# Patient Record
Sex: Male | Born: 1970 | Race: White | Hispanic: No | Marital: Married | Smoking: Current some day smoker
Health system: Southern US, Community
[De-identification: ages and names within clinical notes are randomized; demographics above are authoritative.]

## PROBLEM LIST (undated history)

## (undated) ENCOUNTER — Emergency Department (HOSPITAL_COMMUNITY): Discharge status: Absent without leave | Payer: Managed Care, Other (non HMO)

## (undated) DIAGNOSIS — Z87442 Personal history of urinary calculi: Secondary | ICD-10-CM

## (undated) DIAGNOSIS — Z8739 Personal history of other diseases of the musculoskeletal system and connective tissue: Secondary | ICD-10-CM

## (undated) DIAGNOSIS — K219 Gastro-esophageal reflux disease without esophagitis: Secondary | ICD-10-CM

## (undated) DIAGNOSIS — S069X9A Unspecified intracranial injury with loss of consciousness of unspecified duration, initial encounter: Secondary | ICD-10-CM

## (undated) DIAGNOSIS — E119 Type 2 diabetes mellitus without complications: Secondary | ICD-10-CM

## (undated) DIAGNOSIS — M1990 Unspecified osteoarthritis, unspecified site: Secondary | ICD-10-CM

## (undated) DIAGNOSIS — K589 Irritable bowel syndrome without diarrhea: Secondary | ICD-10-CM

## (undated) DIAGNOSIS — E7800 Pure hypercholesterolemia, unspecified: Secondary | ICD-10-CM

## (undated) DIAGNOSIS — R569 Unspecified convulsions: Secondary | ICD-10-CM

## (undated) DIAGNOSIS — S069XAA Unspecified intracranial injury with loss of consciousness status unknown, initial encounter: Secondary | ICD-10-CM

## (undated) HISTORY — DX: Gastro-esophageal reflux disease without esophagitis: K21.9

## (undated) HISTORY — PX: HAND SURGERY: SHX662

## (undated) HISTORY — DX: Type 2 diabetes mellitus without complications: E11.9

---

## 2000-09-01 ENCOUNTER — Emergency Department (HOSPITAL_COMMUNITY): Admission: EM | Admit: 2000-09-01 | Discharge: 2000-09-01 | Payer: Self-pay | Admitting: Emergency Medicine

## 2000-09-01 ENCOUNTER — Encounter: Payer: Self-pay | Admitting: Emergency Medicine

## 2001-03-10 ENCOUNTER — Emergency Department (HOSPITAL_COMMUNITY): Admission: EM | Admit: 2001-03-10 | Discharge: 2001-03-10 | Payer: Self-pay

## 2001-04-07 ENCOUNTER — Emergency Department (HOSPITAL_COMMUNITY): Admission: EM | Admit: 2001-04-07 | Discharge: 2001-04-07 | Payer: Self-pay | Admitting: Physical Therapy

## 2001-12-28 ENCOUNTER — Emergency Department (HOSPITAL_COMMUNITY): Admission: EM | Admit: 2001-12-28 | Discharge: 2001-12-29 | Payer: Self-pay | Admitting: *Deleted

## 2002-07-25 ENCOUNTER — Encounter: Admission: RE | Admit: 2002-07-25 | Discharge: 2002-08-14 | Payer: Self-pay | Admitting: Occupational Medicine

## 2002-12-01 ENCOUNTER — Emergency Department (HOSPITAL_COMMUNITY): Admission: EM | Admit: 2002-12-01 | Discharge: 2002-12-01 | Payer: Self-pay | Admitting: Emergency Medicine

## 2004-12-20 ENCOUNTER — Emergency Department (HOSPITAL_COMMUNITY): Admission: EM | Admit: 2004-12-20 | Discharge: 2004-12-20 | Payer: Self-pay | Admitting: Emergency Medicine

## 2010-01-19 ENCOUNTER — Encounter: Payer: Self-pay | Admitting: Gastroenterology

## 2010-01-19 ENCOUNTER — Ambulatory Visit: Payer: Self-pay | Admitting: Internal Medicine

## 2010-01-19 DIAGNOSIS — R197 Diarrhea, unspecified: Secondary | ICD-10-CM | POA: Insufficient documentation

## 2010-01-22 LAB — CONVERTED CEMR LAB
ALT: 20 units/L (ref 0–53)
AST: 15 units/L (ref 0–37)
Albumin: 4.3 g/dL (ref 3.5–5.2)
Alkaline Phosphatase: 93 units/L (ref 39–117)
BUN: 10 mg/dL (ref 6–23)
Basophils Absolute: 0.1 10*3/uL (ref 0.0–0.1)
Basophils Relative: 1 % (ref 0–1)
CO2: 23 meq/L (ref 19–32)
Calcium: 9.5 mg/dL (ref 8.4–10.5)
Chloride: 100 meq/L (ref 96–112)
Creatinine, Ser: 1.09 mg/dL (ref 0.40–1.50)
Eosinophils Absolute: 0.1 10*3/uL (ref 0.0–0.7)
Eosinophils Relative: 2 % (ref 0–5)
Glucose, Bld: 96 mg/dL (ref 70–99)
HCT: 45.3 % (ref 39.0–52.0)
Hemoglobin: 15.2 g/dL (ref 13.0–17.0)
IgA: 369 mg/dL (ref 68–378)
Lymphocytes Relative: 30 % (ref 12–46)
Lymphs Abs: 2.5 10*3/uL (ref 0.7–4.0)
MCHC: 33.6 g/dL (ref 30.0–36.0)
MCV: 89.9 fL (ref 78.0–100.0)
Monocytes Absolute: 0.7 10*3/uL (ref 0.1–1.0)
Monocytes Relative: 8 % (ref 3–12)
Neutro Abs: 4.9 10*3/uL (ref 1.7–7.7)
Neutrophils Relative %: 59 % (ref 43–77)
Platelets: 407 10*3/uL — ABNORMAL HIGH (ref 150–400)
Potassium: 4.1 meq/L (ref 3.5–5.3)
RBC: 5.04 M/uL (ref 4.22–5.81)
RDW: 14.3 % (ref 11.5–15.5)
Sodium: 139 meq/L (ref 135–145)
TSH: 2.335 microintl units/mL (ref 0.350–4.500)
Tissue Transglutaminase Ab, IgA: 3.6 units (ref <20)
Total Bilirubin: 0.5 mg/dL (ref 0.3–1.2)
Total Protein: 7.3 g/dL (ref 6.0–8.3)
WBC: 8.3 10*3/uL (ref 4.0–10.5)

## 2010-01-25 ENCOUNTER — Ambulatory Visit: Payer: Self-pay | Admitting: Internal Medicine

## 2010-03-10 ENCOUNTER — Ambulatory Visit: Payer: Self-pay | Admitting: Internal Medicine

## 2010-03-10 DIAGNOSIS — K219 Gastro-esophageal reflux disease without esophagitis: Secondary | ICD-10-CM | POA: Insufficient documentation

## 2010-03-22 ENCOUNTER — Emergency Department (HOSPITAL_COMMUNITY): Admission: EM | Admit: 2010-03-22 | Discharge: 2010-03-22 | Payer: Self-pay | Admitting: Emergency Medicine

## 2010-06-09 ENCOUNTER — Encounter (INDEPENDENT_AMBULATORY_CARE_PROVIDER_SITE_OTHER): Payer: Self-pay | Admitting: *Deleted
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
## 2010-09-21 NOTE — Assessment & Plan Note (Signed)
Summary: DROPPED OFF STOOL.SS    Returned one iFOBT and it was negative.  Allergies: No Known Drug Allergies  Other Orders: Immuno-chemical Fecal Occult (81191)  Appended Document: DROPPED OFF STOOL.SS Stool negative for blood. Please let pt know. Continue plan as outlined at time of OV. Keep OV with RMR!  Appended Document: DROPPED OFF STOOL.SS tried to call pt- LMOM  Appended Document: DROPPED OFF STOOL.SS pt aware
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
## 2010-09-21 NOTE — Letter (Signed)
Summary: Recall Office Visit  [facility]  [address]   [place], [place] [address]   Phone: [phone]  Fax: [phone]      June 09, 2010   [place] [address] [place], [place]  [address] 05/28/1971   Dear Mr. Dekay,   According to our records, it is time for you to schedule a follow-up office visit with [place].   At your convenience, please call [phone] to schedule an office visit. If you have any questions, concerns, or feel that this letter is in error, we would appreciate your call.   Sincerely,    Diana Eves  [facility] Ph: [phone]   Fax: [phone]

## 2011-01-12 ENCOUNTER — Other Ambulatory Visit: Payer: Self-pay | Admitting: Internal Medicine

## 2011-01-21 ENCOUNTER — Telehealth: Payer: Self-pay

## 2011-01-21 MED ORDER — HYOSCYAMINE SULFATE 0.375 MG PO TB12: 0.3750 mg | ORAL_TABLET | Freq: Two times a day (BID) | ORAL | Status: DC | PRN

## 2011-01-21 NOTE — Telephone Encounter (Signed)
Pt called- was last seen by RMR in July 2011. Pt was given hyomax and nexium (per [facility] OV note) pt said hyomax is no longer on his formulary and pt is requesting an alternative. Pt uses [facility]/ [place]

## 2011-01-21 NOTE — Telephone Encounter (Signed)
Sent generic to [facility] Please let pt know Thanks

## 2014-02-05 ENCOUNTER — Ambulatory Visit: Payer: Self-pay | Admitting: Endocrinology

## 2014-02-26 ENCOUNTER — Ambulatory Visit (INDEPENDENT_AMBULATORY_CARE_PROVIDER_SITE_OTHER): Payer: Medicare Other | Admitting: Endocrinology

## 2014-02-26 ENCOUNTER — Encounter: Payer: Self-pay | Admitting: Endocrinology

## 2014-02-26 ENCOUNTER — Other Ambulatory Visit: Payer: Self-pay | Admitting: *Deleted

## 2014-02-26 VITALS — BP 101/64 | HR 65 | Temp 98.2°F | Resp 14 | Ht 64.0 in | Wt 155.2 lb

## 2014-02-26 DIAGNOSIS — E119 Type 2 diabetes mellitus without complications: Secondary | ICD-10-CM

## 2014-02-26 DIAGNOSIS — E1165 Type 2 diabetes mellitus with hyperglycemia: Principal | ICD-10-CM

## 2014-02-26 DIAGNOSIS — IMO0001 Reserved for inherently not codable concepts without codable children: Secondary | ICD-10-CM

## 2014-02-26 DIAGNOSIS — E7800 Pure hypercholesterolemia, unspecified: Secondary | ICD-10-CM

## 2014-02-26 LAB — GLUCOSE, POCT (MANUAL RESULT ENTRY): POC Glucose: 212 mg/dl — AB (ref 70–99)

## 2014-02-26 MED ORDER — LIRAGLUTIDE 18 MG/3ML ~~LOC~~ SOPN: 1.2000 mg | PEN_INJECTOR | Freq: Every day | SUBCUTANEOUS | Status: DC

## 2014-02-26 MED ORDER — INSULIN GLARGINE 100 UNIT/ML SOLOSTAR PEN: 36.0000 [IU] | PEN_INJECTOR | Freq: Every morning | SUBCUTANEOUS | Status: DC

## 2014-02-26 MED ORDER — INSULIN LISPRO 100 UNIT/ML (KWIKPEN): 8.0000 [IU] | PEN_INJECTOR | Freq: Three times a day (TID) | SUBCUTANEOUS | Status: DC

## 2014-02-26 MED ORDER — CANAGLIFLOZIN 100 MG PO TABS: 100.0000 mg | ORAL_TABLET | Freq: Every day | ORAL | Status: DC

## 2014-02-26 NOTE — Patient Instructions (Addendum)
Start VICTOZA injection with the sample pen once daily at the same time of the day preferably at bedtime.  Dial the dose to 0.6 mg for the first week.  You may  experience nausea in the first few days which usually gets better the After 1 week increase the dose to 1.2mg  daily if no nausea.  You may inject in the stomach, thigh or arm.   You will feel fullness of the stomach with starting the medication and should try to keep portions of food small.    Start taking Humalog 6 units with your main meal in the evening  Lantus 36 units in am and adjust based on readings every 3-4 days   Invokana 100 mg daily before breakfast or at the first meal of the day  Avoid high carbohydrate snacks in the morning and have more protein with dairy products or lean meat or eggs  Please check blood sugars at least half the time about 2 hours after any meal and 3-4 times a week on waking up. Please bring blood sugar monitor to each visit

## 2014-02-26 NOTE — Progress Notes (Signed)
Patient ID: Alex GrebeBrian M Bosch, male   DOB: 08/25/1970, 43 y.o.   MRN: [id number]   .   Reason for Appointment: Consultation for Type 2 Diabetes  Referring physician: Merilynn Finlandobertson  History of Present Illness:          Diagnosis: Type 2 diabetes mellitus, date of diagnosis:  2014      Past history:  He apparently had no symptoms at diagnosis and his blood sugar a year before at his physical was normal Details of his initial diagnosis are not available but he thinks his blood sugars were about 180 at that time with A1c around 9% He was initially tried on metformin but he could not tolerate this because of diarrhea Subsequently on his own he was trying to lose weight with diet and apparently lost 40 pounds around the end of 2014  Recent history: His PCP started him on insulin about 6-7 months ago when his blood sugars are high and he was also losing weight. He does not think he had any other symptoms of high blood sugars at that time He was started on small doses of Levemir but because of persistently high readings this was increased progressively and now is taking 22 units twice a day for about a month Did not bring his monitor for download today. He thinks blood sugars are very erratic and fluctuating significantly at various times of the day However he mostly has high readings after supper in the evening  He is concerned about his blood sugar not being controlled and other options besides insulin for treatment His mealtimes and sleeping patterns are very inconsistent as he works 3-4 days a week from 2 AM to 1 PM  Oral hypoglycemic drugs the patient is taking are: none     Side effects from medications have been: Metfromin INSULIN regimen is described as:  Levemir  22 bid for 1 month, previously lower doses  Glucose monitoring: About twice a day         Glucometer:  FreeStyle lite .      Blood Glucose readings from recall  Am 110-300 pcs 250-325   Hypoglycemia:   recently woke up at 5:30 AM  feeling shaky, no documented low sugars     Glycemic control:  No results found for this basename: HGBA1C   Lab Results  Component Value Date   CREATININE 1.09 01/19/2010    Self-care: The diet that the patient has been following is: tries to limit carbohydrates.      Meals: 3 meals per day. Bfst  carbs granola, lunch 9 am biscuit or sandwich dinner 7-8 pm hs snack          Exercise: farm          Dietician visit: Most recent: Never.               Compliance with the medical regimen: good Retinal exam: Most recent: 1 year ago.    Weight history: Previous range 145-185  Wt Readings from Last 3 Encounters:  02/26/14 155 lb 3.2 oz (70.398 kg)  03/10/10 162 lb 8 oz (73.71 kg)  01/19/10 165 lb (74.844 kg)      Medication List       This list is accurate as of: 02/26/14  4:33 PM.  Always use your most recent med list.               atenolol 50 MG tablet  Commonly known as:  TENORMIN  Take 50 mg by mouth  2 (two) times daily.     citalopram 40 MG tablet  Commonly known as:  CELEXA  Take 40 mg by mouth daily.     FREESTYLE LITE TEST VI  by In Vitro route. Checks 3 times a day     LEVEMIR FLEXTOUCH [place]  Inject 22 Units into the skin 2 (two) times daily.     levETIRAcetam 750 MG tablet  Commonly known as:  KEPPRA  Take 750 mg by mouth 2 (two) times daily.     NEXIUM 40 MG capsule  Generic drug:  esomeprazole  TAKE ONE CAPSULE BY MOUTH EVERY DAY     pravastatin 40 MG tablet  Commonly known as:  PRAVACHOL  Take 40 mg by mouth daily.        Allergies:  Allergies  Allergen Reactions  . Codeine Itching    No past medical history on file.  No past surgical history on file.  No family history on file.  Social History:  reports that he has been smoking.  He has never used smokeless tobacco. His alcohol and drug histories are not on file.    Review of Systems       Lipids:        No results found for this basename: CHOL, HDL, LDLCALC, LDLDIRECT, TRIG,  CHOLHDL        No unusual headaches.                  Skin: No rash or infections     Thyroid:  No  unusual fatigue.     The blood pressure has been      No swelling of feet.     No shortness of breath on exertion.     Bowel habits: Normal.       No frequency of urination or nocturia       No joint  pains.         No history of Numbness, tingling or burning in feet     LABS:  Office Visit on 02/26/2014  Component Date Value Ref Range Status  . POC Glucose 02/26/2014 212* 70 - 99 mg/dl Final    Physical Examination:  BP 101/64  Pulse 65  Temp(Src) 98.2 F (36.8 C)  Resp 14  Ht 5\' 4"  (1.626 m)  Wt 155 lb 3.2 oz (70.398 kg)  BMI 26.63 kg/m2  SpO2 95%  GENERAL:         Patient is well built and nourished, no cushingoid features HEENT:         Eye exam shows normal external appearance. Fundus exam shows no retinopathy. Oral exam shows normal mucosa .  NECK:         General:  Neck exam shows no lymphadenopathy.   Thyroid is not enlarged and no nodules felt.   LUNGS:         Chest is symmetrical. Lungs are clear to auscultation.[name]   HEART:         Heart sounds:  S1 and S2 are normal. No murmurs or clicks heard., no S3 or S4.   ABDOMEN:   There is no distention present. Liver and spleen are not palpable. No other mass or tenderness present.  EXTREMITIES:     There is no edema. No skin lesions present.[name]  NEUROLOGICAL:   Vibration sense is minimally reduced in toes. Ankle jerks are absent bilaterally.          Diabetic foot exam:  has no abnormal findings MUSCULOSKELETAL:  There is no enlargement or deformity of the joints. Spine is normal to inspection.[name].   SKIN:       No rash; no significant acanthosis       ASSESSMENT:  Diabetes, unknown type, uncontrolled    His hyperglycemia has been relatively acute in onset although not very symptomatic initially and not clear if he had evidence of ketonuria or ketosis at diagnosis Overall he has lost weight since initial  diagnosis and is not obese now Not clear if he is insulin deficient but currently is having poor control with taking only basal insulin; still appears to be insulin resistant since he is taking 44 units a day and will have high readings even overnight Since he is interested in knowing whether he has type I or type 2 diabetes will be beneficial to check a C-peptide level  Meanwhile he appears to be having significant postprandial hyperglycemia and also some tendency to low normal blood sugars overnight with current regimen of Levemir He is requesting a change to Lantus insulin for a lower co-pay also and maybe would get by with once a day injection  PLAN:    Discussed needing mealtime coverage at least for his main meal with rapid acting insulin because of relatively high postprandial reading at bedtime Discussed with the patient the nature of GLP-1 drugs, the action on various organ systems, how they benefit blood glucose control, as well as the benefit of weight loss and  increase satiety . Explained possible side effects especially nausea and vomiting; discussed safety information in package insert. Described injection technique and dosage titration of Victoza  starting with 0.6 mg once a day at the same time for the first week and then increasing to 1.2 mg if no symptoms of nausea. Patient brochure on Victoza and co-pay card given Trial of Invokana which would help overall with blood sugar control, discussed mechanism of action and benefits on glucose along with other effects on weight and blood pressure, possible side effects Lantus once a day and this will be titrated based on fasting blood sugars  May also consider V-go pump if insulin needs to be continued and changed to basal bolus pattern, this will be beneficial with his variable work schedule  He will bring his monitor for download on the next visit  Patient Instructions  Start VICTOZA injection with the sample pen once daily at the  same time of the day preferably at bedtime.  Dial the dose to 0.6 mg for the first week.  You may  experience nausea in the first few days which usually gets better the After 1 week increase the dose to 1.2mg  daily if no nausea.  You may inject in the stomach, thigh or arm.   You will feel fullness of the stomach with starting the medication and should try to keep portions of food small.    Start taking Humalog 6 units with your main meal in the evening  Lantus 36 units in am and adjust based on readings every 3-4 days   Invokana 100 mg daily before breakfast or at the first meal of the day  Avoid high carbohydrate snacks in the morning and have more protein with dairy products or lean meat or eggs  Please check blood sugars at least half the time about 2 hours after any meal and 3-4 times a week on waking up. Please bring blood sugar monitor to each visit    [facility]) HospitalKUMAR,Jayke Caul 02/26/2014, 4:33 PM   Note: This office note  was prepared with Insurance underwriter. Any transcriptional errors that result from this process are unintentional.  Addendum: Labs as follows  Office Visit on 02/26/2014  Component Date Value Ref Range Status  . POC Glucose 02/26/2014 212* 70 - 99 mg/dl Final  . Hemoglobin C1Y 02/26/2014 11.2* 4.6 - 6.5 % Final   Glycemic Control Guidelines for People with Diabetes:Non Diabetic:  <6%Goal of Therapy: <7%Additional Action Suggested:  >8%   . Sodium 02/26/2014 138  135 - 145 mEq/L Final  . Potassium 02/26/2014 3.8  3.5 - 5.1 mEq/L Final  . Chloride 02/26/2014 104  96 - 112 mEq/L Final  . CO2 02/26/2014 24  19 - 32 mEq/L Final  . Glucose, Bld 02/26/2014 198* 70 - 99 mg/dL Final  . BUN 60/63/0160 11  6 - 23 mg/dL Final  . Creatinine, Ser 02/26/2014 1.0  0.4 - 1.5 mg/dL Final  . Calcium 10/93/2355 9.0  8.4 - 10.5 mg/dL Final  . GFR 73/22/0254 84.07  >60.00 mL/min Final  . C-Peptide 02/26/2014 1.39  0.80 - 3.90 ng/mL Final  . Microalb, Ur 02/26/2014 0.1   0.0 - 1.9 mg/dL Final  . Creatinine,U 27/01/2375 34.4   Final  . Microalb Creat Ratio 02/26/2014 0.3  0.0 - 30.0 mg/g Final

## 2014-02-27 LAB — BASIC METABOLIC PANEL
BUN: 11 mg/dL (ref 6–23)
CHLORIDE: 104 meq/L (ref 96–112)
CO2: 24 mEq/L (ref 19–32)
CREATININE: 1 mg/dL (ref 0.4–1.5)
Calcium: 9 mg/dL (ref 8.4–10.5)
GFR: 84.07 mL/min (ref >60.00)
GLUCOSE: 198 mg/dL — AB (ref 70–99)
POTASSIUM: 3.8 meq/L (ref 3.5–5.1)
Sodium: 138 mEq/L (ref 135–145)

## 2014-02-27 LAB — MICROALBUMIN / CREATININE URINE RATIO
Creatinine,U: 34.4 mg/dL
MICROALB UR: 0.1 mg/dL (ref 0.0–1.9)
Microalb Creat Ratio: 0.3 mg/g (ref 0.0–30.0)

## 2014-02-27 LAB — HEMOGLOBIN A1C: Hgb A1c MFr Bld: 11.2 % — ABNORMAL HIGH (ref 4.6–6.5)

## 2014-02-28 DIAGNOSIS — G40909 Epilepsy, unspecified, not intractable, without status epilepticus: Secondary | ICD-10-CM | POA: Insufficient documentation

## 2014-02-28 DIAGNOSIS — IMO0002 Reserved for concepts with insufficient information to code with codable children: Secondary | ICD-10-CM | POA: Insufficient documentation

## 2014-02-28 DIAGNOSIS — E7800 Pure hypercholesterolemia, unspecified: Secondary | ICD-10-CM | POA: Insufficient documentation

## 2014-02-28 DIAGNOSIS — E1165 Type 2 diabetes mellitus with hyperglycemia: Secondary | ICD-10-CM | POA: Insufficient documentation

## 2014-02-28 LAB — C-PEPTIDE: C PEPTIDE: 1.39 ng/mL (ref 0.80–3.90)

## 2014-02-28 NOTE — Progress Notes (Signed)
Quick Note:  Please let patient know that the C-peptide result is normal and A1c 11.2 ______

## 2014-03-05 ENCOUNTER — Telehealth: Payer: Self-pay | Admitting: Endocrinology

## 2014-03-05 NOTE — Telephone Encounter (Signed)
Pt LM would like to see if there are alternative meds that could replace the victoza is will cost him over $100 and does not have the ability to pay that much

## 2014-03-05 NOTE — Telephone Encounter (Signed)
He needs to find out from [employer] which drug is covered in the family of Victoza. Also he is supposed to be scheduled for followup in 2 weeks and this has not been done

## 2014-03-05 NOTE — Telephone Encounter (Signed)
Please see below and advise.

## 2014-03-05 NOTE — Telephone Encounter (Signed)
Instructions left on patients vm, instructed him to please call and schedule 2 week f/u

## 2014-03-06 NOTE — Telephone Encounter (Signed)
Please see below and advise.

## 2014-03-06 NOTE — Telephone Encounter (Signed)
Pt called back to schedule appointment. Earliest that pt could be scheduled was 04/09/2014. Pt states that he called his insurance company and they would not advise him on what medication were preferred for the Victoza. Insurance instructed him that the Dr needed to come up with a list of medications that he wanted to try and call them back. Insurance stated they could not inform pt of an alternative.  Please advise pt,  Thanks!

## 2014-03-06 NOTE — Telephone Encounter (Signed)
He can take Bydureon, Tanzeum, Trulicity or Byetta

## 2014-03-07 ENCOUNTER — Other Ambulatory Visit: Payer: Self-pay | Admitting: *Deleted

## 2014-03-07 MED ORDER — DULAGLUTIDE 0.75 MG/0.5ML ~~LOC~~ SOAJ: SUBCUTANEOUS | Status: DC

## 2014-03-20 ENCOUNTER — Telehealth: Payer: Self-pay | Admitting: Endocrinology

## 2014-03-20 NOTE — Telephone Encounter (Signed)
Humalog is taken around 7 pm and lantus is taken about an hour later. He has an appt scheduled on 8/19

## 2014-03-20 NOTE — Telephone Encounter (Signed)
Need to see him as soon as possible, okay to reduce Lantus as discussed

## 2014-03-20 NOTE — Telephone Encounter (Signed)
Patient called and said in the last week and a half his sugars have been in the upper 40's and mid 50's, usually around midnight. He says he think it's been 3-4 times.  Below is what he's taking for his sugar, please advise.

## 2014-03-20 NOTE — Telephone Encounter (Signed)
Patient stated that his blood has been dropping really low, he is taking Humalog 100 unit, Invokana 100 mg and Lantus 100 units. He want to know if he need to stop taking the Lantus.  Please advise

## 2014-03-20 NOTE — Telephone Encounter (Signed)
Reduce Lantus to 14, when does he take pm Humalog? New appt scheduled?

## 2014-03-20 NOTE — Telephone Encounter (Signed)
Message is not clear: What dose of Humalog in Lantus is he taking and are the sugars low after dinner?. He needs to be seen as soon as possible instead of in 3 weeks

## 2014-03-20 NOTE — Telephone Encounter (Signed)
He's taking 20 units of lantus, and 6 units of  humalog both at night  and he's taking the Invokana 100mg  in the morning.

## 2014-03-20 NOTE — Telephone Encounter (Signed)
Message said he's taking 100 units of each.  He said his sugars have only been low around midnight and that it wakes him up.

## 2014-03-27 ENCOUNTER — Emergency Department (HOSPITAL_COMMUNITY)
Admission: EM | Admit: 2014-03-27 | Discharge: 2014-03-27 | Disposition: A | Discharge status: Home / Self Care | Payer: Medicare Other | Attending: Emergency Medicine | Admitting: Emergency Medicine

## 2014-03-27 ENCOUNTER — Other Ambulatory Visit: Payer: Self-pay

## 2014-03-27 ENCOUNTER — Encounter (HOSPITAL_COMMUNITY): Payer: Self-pay | Admitting: Emergency Medicine

## 2014-03-27 ENCOUNTER — Emergency Department (HOSPITAL_COMMUNITY): Payer: Medicare Other

## 2014-03-27 DIAGNOSIS — E7800 Pure hypercholesterolemia, unspecified: Secondary | ICD-10-CM | POA: Diagnosis not present

## 2014-03-27 DIAGNOSIS — Z79899 Other long term (current) drug therapy: Secondary | ICD-10-CM | POA: Insufficient documentation

## 2014-03-27 DIAGNOSIS — Z87820 Personal history of traumatic brain injury: Secondary | ICD-10-CM | POA: Insufficient documentation

## 2014-03-27 DIAGNOSIS — Z794 Long term (current) use of insulin: Secondary | ICD-10-CM | POA: Insufficient documentation

## 2014-03-27 DIAGNOSIS — F17200 Nicotine dependence, unspecified, uncomplicated: Secondary | ICD-10-CM | POA: Diagnosis not present

## 2014-03-27 DIAGNOSIS — R079 Chest pain, unspecified: Secondary | ICD-10-CM

## 2014-03-27 DIAGNOSIS — G40909 Epilepsy, unspecified, not intractable, without status epilepticus: Secondary | ICD-10-CM | POA: Insufficient documentation

## 2014-03-27 HISTORY — DX: Unspecified intracranial injury with loss of consciousness of unspecified duration, initial encounter: S06.9X9A

## 2014-03-27 HISTORY — DX: Unspecified intracranial injury with loss of consciousness status unknown, initial encounter: S06.9XAA

## 2014-03-27 HISTORY — DX: Pure hypercholesterolemia, unspecified: E78.00

## 2014-03-27 HISTORY — DX: Unspecified convulsions: R56.9

## 2014-03-27 LAB — COMPREHENSIVE METABOLIC PANEL
ALT: 17 U/L (ref 0–53)
AST: 13 U/L (ref 0–37)
Albumin: 3.7 g/dL (ref 3.5–5.2)
Alkaline Phosphatase: 86 U/L (ref 39–117)
Anion gap: 15 (ref 5–15)
BUN: 22 mg/dL (ref 6–23)
CALCIUM: 9 mg/dL (ref 8.4–10.5)
CO2: 23 mEq/L (ref 19–32)
CREATININE: 1.07 mg/dL (ref 0.50–1.35)
Chloride: 95 mEq/L — ABNORMAL LOW (ref 96–112)
GFR calc Af Amer: >90 mL/min (ref >90)
GFR, EST NON AFRICAN AMERICAN: 84 mL/min — AB (ref >90)
Glucose, Bld: 278 mg/dL — ABNORMAL HIGH (ref 70–99)
Potassium: 3.8 mEq/L (ref 3.7–5.3)
Sodium: 133 mEq/L — ABNORMAL LOW (ref 137–147)
Total Bilirubin: <0.2 mg/dL — ABNORMAL LOW (ref 0.3–1.2)
Total Protein: 7.3 g/dL (ref 6.0–8.3)

## 2014-03-27 LAB — CBC WITH DIFFERENTIAL/PLATELET
BASOS ABS: 0.1 10*3/uL (ref 0.0–0.1)
Basophils Relative: 1 % (ref 0–1)
EOS PCT: 2 % (ref 0–5)
Eosinophils Absolute: 0.2 10*3/uL (ref 0.0–0.7)
HCT: 42.8 % (ref 39.0–52.0)
Hemoglobin: 15.5 g/dL (ref 13.0–17.0)
Lymphocytes Relative: 32 % (ref 12–46)
Lymphs Abs: 3 10*3/uL (ref 0.7–4.0)
MCH: 30.9 pg (ref 26.0–34.0)
MCHC: 36.2 g/dL — AB (ref 30.0–36.0)
MCV: 85.4 fL (ref 78.0–100.0)
MONO ABS: 0.8 10*3/uL (ref 0.1–1.0)
Monocytes Relative: 9 % (ref 3–12)
Neutro Abs: 5.2 10*3/uL (ref 1.7–7.7)
Neutrophils Relative %: 56 % (ref 43–77)
Platelets: 315 10*3/uL (ref 150–400)
RBC: 5.01 MIL/uL (ref 4.22–5.81)
RDW: 12.4 % (ref 11.5–15.5)
WBC: 9.3 10*3/uL (ref 4.0–10.5)

## 2014-03-27 LAB — D-DIMER, QUANTITATIVE (NOT AT ARMC): D DIMER QUANT: 0.36 ug{FEU}/mL (ref 0.00–0.48)

## 2014-03-27 LAB — TROPONIN I

## 2014-03-27 MED ORDER — KETOROLAC TROMETHAMINE 30 MG/ML IJ SOLN
30.0000 mg | Freq: Once | INTRAMUSCULAR | Status: AC
  Administered 2014-03-27: 30 mg via INTRAVENOUS
  Filled 2014-03-27: qty 1

## 2014-03-27 NOTE — Discharge Instructions (Signed)
Ibuprofen 600 mg every 6 hours as needed for pain.   Followup with cardiology. The contact information has been provided in this discharge summary. Call to arrange this appointment. Return to the ER if your symptoms substantially worsen or change.   Chest Pain (Nonspecific) It is often hard to give a specific diagnosis for the cause of chest pain. There is always a chance that your pain could be related to something serious, such as a heart attack or a blood clot in the lungs. You need to follow up with your health care provider for further evaluation. CAUSES   Heartburn.  Pneumonia or bronchitis.  Anxiety or stress.  Inflammation around your heart (pericarditis) or lung (pleuritis or pleurisy).  A blood clot in the lung.  A collapsed lung (pneumothorax). It can develop suddenly on its own (spontaneous pneumothorax) or from trauma to the chest.  Shingles infection (herpes zoster virus). The chest wall is composed of bones, muscles, and cartilage. Any of these can be the source of the pain.  The bones can be bruised by injury.  The muscles or cartilage can be strained by coughing or overwork.  The cartilage can be affected by inflammation and become sore (costochondritis). DIAGNOSIS  Lab tests or other studies may be needed to find the cause of your pain. Your health care provider may have you take a test called an ambulatory electrocardiogram (ECG). An ECG records your heartbeat patterns over a 24-hour period. You may also have other tests, such as:  Transthoracic echocardiogram (TTE). During echocardiography, sound waves are used to evaluate how blood flows through your heart.  Transesophageal echocardiogram (TEE).  Cardiac monitoring. This allows your health care provider to monitor your heart rate and rhythm in real time.  Holter monitor. This is a portable device that records your heartbeat and can help diagnose heart arrhythmias. It allows your health care provider to track  your heart activity for several days, if needed.  Stress tests by exercise or by giving medicine that makes the heart beat faster. TREATMENT   Treatment depends on what may be causing your chest pain. Treatment may include:  Acid blockers for heartburn.  Anti-inflammatory medicine.  Pain medicine for inflammatory conditions.  Antibiotics if an infection is present.  You may be advised to change lifestyle habits. This includes stopping smoking and avoiding alcohol, caffeine, and chocolate.  You may be advised to keep your head raised (elevated) when sleeping. This reduces the chance of acid going backward from your stomach into your esophagus. Most of the time, nonspecific chest pain will improve within 2-3 days with rest and mild pain medicine.  HOME CARE INSTRUCTIONS   If antibiotics were prescribed, take them as directed. Finish them even if you start to feel better.  For the next few days, avoid physical activities that bring on chest pain. Continue physical activities as directed.  Do not use any tobacco products, including cigarettes, chewing tobacco, or electronic cigarettes.  Avoid drinking alcohol.  Only take medicine as directed by your health care provider.  Follow your health care provider's suggestions for further testing if your chest pain does not go away.  Keep any follow-up appointments you made. If you do not go to an appointment, you could develop lasting (chronic) problems with pain. If there is any problem keeping an appointment, call to reschedule. SEEK MEDICAL CARE IF:   Your chest pain does not go away, even after treatment.  You have a rash with blisters on your chest.  You have a fever. SEEK IMMEDIATE MEDICAL CARE IF:   You have increased chest pain or pain that spreads to your arm, neck, jaw, back, or abdomen.  You have shortness of breath.  You have an increasing cough, or you cough up blood.  You have severe back or abdominal pain.  You  feel nauseous or vomit.  You have severe weakness.  You faint.  You have chills. This is an emergency. Do not wait to see if the pain will go away. Get medical help at once. Call your local emergency services (911 in [place] Do not drive yourself to the hospital. MAKE SURE YOU:   Understand these instructions.  Will watch your condition.  Will get help right away if you are not doing well or get worse. Document Released: 05/18/2005 Document Revised: 08/13/2013 Document Reviewed: 03/13/2008 [facility] [place] Patient Information 2015 [employer]. This information is not intended to replace advice given to you by your health care provider. Make sure you discuss any questions you have with your health care provider.

## 2014-03-27 NOTE — ED Notes (Signed)
Pt c/o intermittent cp with weakness/sob and nausea today.

## 2014-03-27 NOTE — ED Provider Notes (Addendum)
CSN: [id number]     Arrival date & time 03/27/14  1723 History   First MD Initiated Contact with Patient 03/27/14 1742     Chief Complaint  Patient presents with  . Chest Pain     (Consider location/radiation/quality/duration/timing/severity/associated sxs/prior Treatment) HPI Comments: Patient is a 43 year old male with history of diabetes diagnosed 2 years ago. He presents today with complaints of sharp pains in the left side of his chest that started upon waking this morning. He denies any nausea. States that he feels short of breath intermittently. His pain is worse with movement and palpation and relieved with rest.  Patient is a 43 y.o. male presenting with chest pain. The history is provided by the patient.  Chest Pain Pain location:  Substernal area Pain quality: sharp   Pain radiates to:  L shoulder Pain radiates to the back: no   Pain severity:  Moderate Onset quality:  Sudden Duration:  12 hours Timing:  Constant Progression:  Unchanged Chronicity:  New Context: movement   Context comment:  Position Relieved by:  Rest Worsened by:  Certain positions and movement   Past Medical History  Diagnosis Date  . High cholesterol   . TBI (traumatic brain injury)   . Seizures    Past Surgical History  Procedure Laterality Date  . Hand surgery     Family History  Problem Relation Age of Onset  . Diabetes Mother   . Diabetes Paternal Grandmother   . Diabetes Paternal Grandfather    History  Substance Use Topics  . Smoking status: Current Every Day Smoker  . Smokeless tobacco: Never Used  . Alcohol Use: No    Review of Systems  Cardiovascular: Positive for chest pain.  All other systems reviewed and are negative.     Allergies  Codeine  Home Medications   Prior to Admission medications   Medication Sig Start Date End Date Taking? Authorizing Provider  atenolol (TENORMIN) 50 MG tablet Take 100 mg by mouth daily.    Yes Historical Provider, MD   Canagliflozin (INVOKANA) 100 MG TABS Take 1 tablet (100 mg total) by mouth daily. 02/26/14  Yes Reather Littler, MD  citalopram (CELEXA) 40 MG tablet Take 40 mg by mouth daily.   Yes Historical Provider, MD  Insulin Glargine (LANTUS SOLOSTAR) 100 UNIT/ML Solostar Pen Inject 14 Units into the skin daily at 10 pm.   Yes Historical Provider, MD  insulin lispro (HUMALOG KWIKPEN) 100 UNIT/ML KiwkPen Inject 6 Units into the skin daily.   Yes Historical Provider, MD  levETIRAcetam (KEPPRA) 750 MG tablet Take 1,500 mg by mouth 2 (two) times daily.   Yes Historical Provider, MD  omeprazole (PRILOSEC) 20 MG capsule Take 20 mg by mouth daily.   Yes Historical Provider, MD  pravastatin (PRAVACHOL) 40 MG tablet Take 40 mg by mouth daily.   Yes Historical Provider, MD   BP 111/78  Pulse 65  Temp(Src) 98.3 F (36.8 C) (Oral)  Resp 14  Ht 5\' 4"  (1.626 m)  Wt 155 lb (70.308 kg)  BMI 26.59 kg/m2  SpO2 98% Physical Exam  Nursing note and vitals reviewed. Constitutional: He is oriented to person, place, and time. He appears well-developed and well-nourished. No distress.  HENT:  Head: Normocephalic and atraumatic.  Mouth/Throat: Oropharynx is clear and moist.  Neck: Normal range of motion. Neck supple.  Cardiovascular: Normal rate, regular rhythm and normal heart sounds.   No murmur heard. Pulmonary/Chest: Breath sounds normal. No respiratory distress. He has no wheezes.  He exhibits tenderness.  Abdominal: Soft. Bowel sounds are normal. He exhibits no distension. There is no tenderness.  Musculoskeletal: Normal range of motion. He exhibits no edema.  Neurological: He is alert and oriented to person, place, and time.  Skin: Skin is warm and dry. He is not diaphoretic.    ED Course  Procedures (including critical care time) Labs Review Labs Reviewed  CBC WITH DIFFERENTIAL - Abnormal; Notable for the following:    MCHC 36.2 (*)    All other components within normal limits  COMPREHENSIVE METABOLIC PANEL -  Abnormal; Notable for the following:    Sodium 133 (*)    Chloride 95 (*)    Glucose, Bld 278 (*)    Total Bilirubin <0.2 (*)    GFR calc non Af Amer 84 (*)    All other components within normal limits  D-DIMER, QUANTITATIVE  TROPONIN I    Imaging Review Dg Chest 2 View  03/27/2014   CLINICAL DATA:  Chest pain  EXAM: CHEST  2 VIEW  COMPARISON:  None.  FINDINGS: The heart size and mediastinal contours are within normal limits. Both lungs are clear. The visualized skeletal structures are unremarkable.  IMPRESSION: No active cardiopulmonary disease.   Electronically Signed   By: Marlan Palauharles  Clark M.D.   On: 03/27/2014 19:19     EKG Interpretation   Date/Time:  Thursday March 27 2014 17:31:40 EDT Ventricular Rate:  78 PR Interval:  156 QRS Duration: 88 QT Interval:  392 QTC Calculation: 446 R Axis:   35 Text Interpretation:  Normal sinus rhythm Normal ECG Confirmed by DELOS   MD, Herron Fero ([id number]) on 03/27/2014 8:01:25 PM      MDM   Final diagnoses:  None    He presents with complaints of sharp pain to the left lateral chest. This started this morning shortly upon waking. It is worse with position and palpation. His symptoms are atypical for cardiac pain an EKG is normal with negative troponin. He was also found to have a negative d-dimer with no hypoxia and no tachycardia.  I feel as though pulmonary embolism is unlikely. At this point I feel as though he is appropriate for discharge. I will recommend continued anti-inflammatories and when necessary return.  As the patient is diabetic and has a family history of heart problems, I will provide [place]. Although his symptoms are atypical and workup is negative and I have a low suspicion of a cardiac etiology, I do feel as though a baseline stress test may be in his best interest.   Geoffery Lyonsouglas Charidy Cappelletti, MD 03/27/14 [id number]  Geoffery Lyonsouglas Lexton Hidalgo, MD 03/27/14 2045

## 2014-04-09 ENCOUNTER — Ambulatory Visit: Payer: Medicare Other | Admitting: Endocrinology

## 2014-04-16 ENCOUNTER — Ambulatory Visit (INDEPENDENT_AMBULATORY_CARE_PROVIDER_SITE_OTHER): Payer: Medicare Other | Admitting: Endocrinology

## 2014-04-16 ENCOUNTER — Encounter: Payer: Self-pay | Admitting: Endocrinology

## 2014-04-16 VITALS — BP 118/64 | HR 96 | Temp 98.3°F | Resp 14 | Ht 64.0 in | Wt 147.4 lb

## 2014-04-16 DIAGNOSIS — IMO0001 Reserved for inherently not codable concepts without codable children: Secondary | ICD-10-CM

## 2014-04-16 DIAGNOSIS — E1165 Type 2 diabetes mellitus with hyperglycemia: Principal | ICD-10-CM

## 2014-04-16 DIAGNOSIS — E7800 Pure hypercholesterolemia, unspecified: Secondary | ICD-10-CM

## 2014-04-16 LAB — BASIC METABOLIC PANEL
BUN: 16 mg/dL (ref 6–23)
CALCIUM: 9.4 mg/dL (ref 8.4–10.5)
CO2: 28 mEq/L (ref 19–32)
CREATININE: 1.2 mg/dL (ref 0.4–1.5)
Chloride: 98 mEq/L (ref 96–112)
GFR: 71.82 mL/min (ref >60.00)
Glucose, Bld: 284 mg/dL — ABNORMAL HIGH (ref 70–99)
Potassium: 4.4 mEq/L (ref 3.5–5.1)
Sodium: 134 mEq/L — ABNORMAL LOW (ref 135–145)

## 2014-04-16 NOTE — Patient Instructions (Signed)
Lantus 16 and take 4 Humalog for lunch  More sugars 2-3 hours after any meal and target <180 at least and waking up should be 80-120

## 2014-04-16 NOTE — Progress Notes (Signed)
Patient ID: Alex Tucker, male   DOB: 1971-04-23, 43 y.o.   MRN: [id number]    Reason for Appointment: F/u for Type 2 Diabetes  Referring physician: Merilynn Finland  History of Present Illness:          Diagnosis: Type 2 diabetes mellitus, date of diagnosis:  2014      Past history:  He apparently had no symptoms at diagnosis and his blood sugar a year before at his physical was normal Details of his initial diagnosis are not available but he thinks his blood sugars were about 180 at that time with A1c around 9% He was initially tried on metformin but he could not tolerate this because of diarrhea Subsequently on his own he was trying to lose weight with diet and apparently lost 40 pounds around the end of 2014  Recent history: His PCP started him on insulin in 12/14 approximately when his A1c was high and he was also losing weight.  He was started on only Levemir and this was increased progressively up to 22 units twice a day prior to his consultation He was told to try Invokana and Victoza along with his basal insulin but he could not get Victoza because of non-coverage by his insurance and is only Invokana Also his Levemir was changed to Lantus insulin once a day; initially was started on 20 units Subsequently he started having lower readings overnight and because of waking up with low sugars in the 40s his Lantus dose was reduced to 14 units Also to cover his postprandial hyperglycemia was started on Humalog 6 units at his main meal Currently his blood sugars are difficult to evaluate because of his variable sleeping times in working from early morning to early afternoon Also his monitor appears to be about 5 hours behind  Currently appears to have the following blood sugar patterns:  Blood sugars when he is waking up on his off days are recently higher and only occasionally near normal.  Blood sugars are significantly high in the late afternoon probably when he is coming back from work,  highest reading 415  Blood sugars in the evenings are probably bedtime readings and they are only modestly high  Has had one low blood sugar of 45 at about 1-2 AM last Thursday, probably before work His mealtimes and sleeping times are as follows: Working days: He sleeps from 8 PM to 2 AM  and works from 3 AM to about 2 PM On working days his meals are at 7-8 PM, lunch at 9 AM at work and snack at 3 PM On his off days he will sleep at night and eats 2-3 meals at regular times Oral hypoglycemic drugs the patient is taking are: Invokana     Side effects from medications have been: Metfromin INSULIN regimen is described as:  Lantus 14 units at 8 pm, Humalog 6 units at supper  Glucose monitoring: About twice a day         Glucometer:  FreeStyle lite .      Blood Glucose readings as above  Hypoglycemia:   once as above, also previously had some lower readings not documented on current download  Glycemic control:  Lab Results  Component Value Date   HGBA1C 11.2* 02/26/2014   Lab Results  Component Value Date   MICROALBUR 0.1 02/26/2014   CREATININE 1.07 03/27/2014    Self-care: The diet that the patient has been following is: tries to limit carbohydrates.   Sleep at 8 pm  and work 2 am to 2 p m  Meals: 3 meals per day. Bfst: granola, lunch 9 am biscuit or sandwich; dinner 7-8 pm hs snack          Exercise: farm          Dietician visit: Most recent: Never.               Compliance with the medical regimen: good Retinal exam: Most recent: 1 year ago.    Weight history: Previous range 145-185  Wt Readings from Last 3 Encounters:  04/16/14 147 lb 6.4 oz (66.86 kg)  03/27/14 155 lb (70.308 kg)  02/26/14 155 lb 3.2 oz (70.398 kg)      Medication List       This list is accurate as of: 04/16/14  2:59 PM.  Always use your most recent med list.               atenolol 50 MG tablet  Commonly known as:  TENORMIN  Take 100 mg by mouth daily.     Canagliflozin 100 MG Tabs  Commonly  known as:  INVOKANA  Take 1 tablet (100 mg total) by mouth daily.     citalopram 40 MG tablet  Commonly known as:  CELEXA  Take 40 mg by mouth daily.     HUMALOG KWIKPEN 100 UNIT/ML KiwkPen  Generic drug:  insulin lispro  Inject 6 Units into the skin daily.     LANTUS SOLOSTAR 100 UNIT/ML Solostar Pen  Generic drug:  Insulin Glargine  Inject 14 Units into the skin daily at 10 pm.     levETIRAcetam 750 MG tablet  Commonly known as:  KEPPRA  Take 1,500 mg by mouth 2 (two) times daily.     omeprazole 20 MG capsule  Commonly known as:  PRILOSEC  Take 20 mg by mouth daily.     pravastatin 40 MG tablet  Commonly known as:  PRAVACHOL  Take 40 mg by mouth daily.        Allergies:  Allergies  Allergen Reactions  . Codeine Itching    Past Medical History  Diagnosis Date  . High cholesterol   . TBI (traumatic brain injury)   . Seizures     Past Surgical History  Procedure Laterality Date  . Hand surgery      Family History  Problem Relation Age of Onset  . Diabetes Mother   . Diabetes Paternal Grandmother   . Diabetes Paternal Grandfather     Social History:  reports that he has been smoking.  He has never used smokeless tobacco. He reports that he does not drink alcohol or use illicit drugs.    Review of Systems       Lipids:        No results found for this basename: CHOL,  HDL,  LDLCALC,  LDLDIRECT,  TRIG,  CHOLHDL     Physical Examination:  BP 118/64  Pulse 96  Temp(Src) 98.3 F (36.8 C)  Resp 14  Ht  (1.626 m)  Wt 147 lb 6.4 oz (66.86 kg)  BMI 25.29 kg/m2  SpO2 96%       ASSESSMENT:  Diabetes, unknown type, uncontrolled   His blood sugars appear to be somewhat better with using basal bolus insulin with Lantus and suppertime Humalog He is requiring less insulin especially with starting Invokana at the same time Also may have lost some weight  with Invokana and reducing the amount of basal insulin He thinks his  diet is fairly good  but sometimes eating high-fat meals like biscuits at lunch He is quite likely insulin deficient even though his C-peptide is not low As discussed in history of present illness it is difficult to analyze his home blood sugars because of his working very odd hours and also has variable mealtimes Difficult to know which readings are on his off days in which are fasting However most likely he is having high readings from not covering his meal at work More recently also his fasting readings on his off days appear to be relatively high  PLAN:   Increase Lantus by 2 units  Start taking at least 4 units coverage for lunch when eating out  More blood sugars after meals  Consider switching to the Verio monitor to identify postprandial readings  Continue Invokana  His glucose monitor was reset to the correct time today  Will check his fructosamine to evaluate recent level of control  Counseling time over 50% of today's 25 minute visit  Patient Instructions  Lantus 16 and take 4 Humalog for lunch  More sugars 2-3 hours after any meal and target <180 at least and waking up should be 80-120    Anikka Marsan 04/16/2014, 2:59 PM   Note: This office note was prepared with Dragon voice recognition system technology. Any transcriptional errors that result from this process are unintentional.

## 2014-04-17 LAB — FRUCTOSAMINE: Fructosamine: 381 umol/L — ABNORMAL HIGH (ref 0–285)

## 2014-05-01 ENCOUNTER — Telehealth: Payer: Self-pay | Admitting: Endocrinology

## 2014-05-01 NOTE — Telephone Encounter (Signed)
Patient called and is concerned  Blood sugars are 250-300 as high as 350 Patient wants to know if he can take a couple units to bring levels down   Thank you

## 2014-05-01 NOTE — Telephone Encounter (Signed)
Sugars are high on waking up needs to increase Lantus by 4 units. He can take extra 2-4 units Humalog for high readings and may need to take the Humalog with every meal if sugars are higher after eating

## 2014-05-01 NOTE — Telephone Encounter (Signed)
Please see below and advise.

## 2014-05-02 NOTE — Telephone Encounter (Signed)
Instructions given to patient

## 2014-05-28 ENCOUNTER — Ambulatory Visit (INDEPENDENT_AMBULATORY_CARE_PROVIDER_SITE_OTHER): Payer: Medicare Other | Admitting: Endocrinology

## 2014-05-28 ENCOUNTER — Encounter: Payer: Self-pay | Admitting: Endocrinology

## 2014-05-28 VITALS — BP 120/78 | HR 66 | Temp 98.2°F | Resp 14 | Ht 64.0 in | Wt 152.4 lb

## 2014-05-28 DIAGNOSIS — IMO0002 Reserved for concepts with insufficient information to code with codable children: Secondary | ICD-10-CM

## 2014-05-28 DIAGNOSIS — E1165 Type 2 diabetes mellitus with hyperglycemia: Secondary | ICD-10-CM

## 2014-05-28 LAB — LIPID PANEL
CHOL/HDL RATIO: 6
Cholesterol: 207 mg/dL — ABNORMAL HIGH (ref 0–200)
HDL: 34.2 mg/dL — ABNORMAL LOW (ref >39.00)
LDL CALC: 134 mg/dL — AB (ref 0–99)
NonHDL: 172.8
TRIGLYCERIDES: 196 mg/dL — AB (ref 0.0–149.0)
VLDL: 39.2 mg/dL (ref 0.0–40.0)

## 2014-05-28 LAB — HEMOGLOBIN A1C: Hgb A1c MFr Bld: 10.2 % — ABNORMAL HIGH (ref 4.6–6.5)

## 2014-05-28 LAB — GLUCOSE, RANDOM: Glucose, Bld: 190 mg/dL — ABNORMAL HIGH (ref 70–99)

## 2014-05-28 MED ORDER — INSULIN GLARGINE 300 UNIT/ML ~~LOC~~ SOPN: 26.0000 [IU] | PEN_INJECTOR | Freq: Once | SUBCUTANEOUS | Status: DC

## 2014-05-28 NOTE — Patient Instructions (Signed)
Humalog  8-10 units at meals based on meal size and Carbs  Lantus 14 units at 8 am and 10 at pm  If sugar at 8 pm is still over 150 then go up 2 on am Lantus

## 2014-05-28 NOTE — Progress Notes (Signed)
Patient ID: Alex Tucker, male   DOB: 07-10-1971, 43 y.o.   MRN: [id number]    Reason for Appointment: F/u for Type 2 Diabetes  Referring physician: Merilynn Finland  History of Present Illness:          Diagnosis: Type 2 diabetes mellitus, date of diagnosis:  2014      Past history:  He apparently had no symptoms at diagnosis and his blood sugar a year before at his physical was normal Details of his initial diagnosis are not available but he thinks his blood sugars were about 180 at that time with A1c around 9% He was initially tried on metformin but he could not tolerate this because of diarrhea Subsequently on his own he was trying to lose weight with diet and apparently lost 40 pounds around the end of 2014  Recent history: His PCP started him on insulin in 12/14 approximately when his A1c was high and he was also losing weight.  He was started on only Levemir and this was increased progressively up to 22 units twice a day prior to his consultation He was told to try Invokana and Victoza along with his basal insulin but he could not get Victoza because of non-coverage by his insurance  Also his Levemir was changed to Lantus insulin once a day; initially was started on 20 units Subsequently he started having lower readings, was waking up with low sugars in the 40s; his Lantus dose was reduced to 14 units However he claims that he was having side effects of not feeling well and having some nausea with Invokana and stopped this on his own after his visit in 8/15 Since then his blood sugars appear to be significantly high overall and averaging 230 He has increased his Lantus back to 20 units but still has inadequate control throughout the day HUMALOG insulin: He was taking 6 units but now taking 8-10 units when the blood sugars are higher He says that he does feel better subjectively recently despite his blood sugars being out of control Also to cover his postprandial hyperglycemia was started on  Humalog 6 units at his main meal Again his blood sugars are somewhat difficult to evaluate because of his variable sleeping times; still working from about 3 AM until 2 PM No recent A1c available  Currently appears to have the following blood sugar patterns:  Blood sugars are overall averaging 231 with the highest average midday of 297 but also relatively higher around 6 PM  Blood sugars are below 200 mostly around 8 AM  However has significant variability of blood sugars especially around 6-9 PM His mealtimes and sleeping times are as follows: Working days: He sleeps from 8 PM to 2 AM  and works from 3 AM to about 2 PM On working days his meals are at 7-8 PM, lunch at 9 AM at work and snack at 3 PM On his off days he will sleep at night and eats 2-3 meals at regular times Oral hypoglycemic drugs the patient is taking are: None Side effects from medications have been: Metformin causes diarrhea INSULIN regimen is described as:  Lantus 20 units at 8 pm, Humalog 6 units at supper, 2-4 units for high readings  Hypoglycemia:   once as above, also previously had some lower readings not documented on current download  Glycemic control:  Lab Results  Component Value Date   HGBA1C 11.2* 02/26/2014   Lab Results  Component Value Date   MICROALBUR 0.1 02/26/2014  CREATININE 1.2 04/16/2014    Self-care: The diet that the patient has been following is: tries to limit carbohydrates.   Sleep at 8 pm  and work 2 am to 2 p m  Meals: 3 meals per day. Bfst: granola, lunch 9 am biscuit or sandwich; dinner 7-8 pm hs snack          Exercise: farm          Dietician visit: Most recent: Never.               Compliance with the medical regimen: good Retinal exam: Most recent: 1 year ago.    Weight history: Previous range 145-185  Wt Readings from Last 3 Encounters:  05/28/14 152 lb 6.4 oz (69.128 kg)  04/16/14 147 lb 6.4 oz (66.86 kg)  03/27/14 155 lb (70.308 kg)      Medication List        This list is accurate as of: 05/28/14  2:03 PM.  Always use your most recent med list.               atenolol 50 MG tablet  Commonly known as:  TENORMIN  Take 100 mg by mouth daily.     Canagliflozin 100 MG Tabs  Commonly known as:  INVOKANA  Take 1 tablet (100 mg total) by mouth daily.     citalopram 40 MG tablet  Commonly known as:  CELEXA  Take 40 mg by mouth daily.     HUMALOG KWIKPEN 100 UNIT/ML KiwkPen  Generic drug:  insulin lispro  Inject 6 Units into the skin daily. SS,     Insulin Glargine 300 UNIT/ML Sopn  Commonly known as:  TOUJEO SOLOSTAR  Inject 26 Units into the skin once.     levETIRAcetam 750 MG tablet  Commonly known as:  KEPPRA  Take 1,500 mg by mouth 2 (two) times daily.     omeprazole 20 MG capsule  Commonly known as:  PRILOSEC  Take 20 mg by mouth daily.     pravastatin 40 MG tablet  Commonly known as:  PRAVACHOL  Take 40 mg by mouth daily.        Allergies:  Allergies  Allergen Reactions  . Codeine Itching    Past Medical History  Diagnosis Date  . High cholesterol   . TBI (traumatic brain injury)   . Seizures     Past Surgical History  Procedure Laterality Date  . Hand surgery      Family History  Problem Relation Age of Onset  . Diabetes Mother   . Diabetes Paternal Grandmother   . Diabetes Paternal Grandfather     Social History:  reports that he has been smoking.  He has never used smokeless tobacco. He reports that he does not drink alcohol or use illicit drugs.    Review of Systems       Lipids:        No results found for this basename: CHOL,  HDL,  LDLCALC,  LDLDIRECT,  TRIG,  CHOLHDL     Physical Examination:  BP 120/78  Pulse 66  Temp(Src) 98.2 F (36.8 C)  Resp 14  Ht 5\' 4"  (1.626 m)  Wt 152 lb 6.4 oz (69.128 kg)  BMI 26.15 kg/m2  SpO2 95%       ASSESSMENT:  Diabetes, unknown type, uncontrolled   See history of present illness for detailed discussion on his current blood sugar patterns  and problems identified His blood sugars are significantly high even  with using basal bolus insulin with Lantus and suppertime Humalog He stopped his Invokana and does not think he was tolerating well even though his sugars were better with this Discussed that he will need to increase his insulin significantly especially basal insulin to get better control Most likely needs to split his Lantus to twice a day since blood sugars are usually higher toward the evening before his Lantus dose and he is concerned about low sugars during his sleep if he takes a higher dose before bedtime Although he is interested in insulin pump this may not be covered by his insurance and C-peptide is not low  PLAN:   Change Lantus to 14 in the morning and 10 in the evening and discussed how to titrate this  He will use Toujeo from his next prescription once a day and use a total dose of at least 2 units more than the Lantus  Start taking at least 8 units Humalog coverage for his meals plus additional 2-4 units for high readings, will need to adjust the base dose also based on his meal size and carbohydrate intake  More blood sugars after meals  Check A1c today  He was given information to research on insulin pumps and contact the manufacturer for insurance requirements  Counseling time over 50% of today's 25 minute visit  Patient Instructions  Humalog  8-10 units at meals based on meal size and Carbs  Lantus 14 units at 8 am and 10 at pm  If sugar at 8 pm is still over 150 then go up 2 on am Lantus      Ein Rijo 05/28/2014, 2:03 PM   Note: This office note was prepared with Insurance underwriterDragon voice recognition system technology. Any transcriptional errors that result from this process are unintentional.

## 2014-05-30 ENCOUNTER — Telehealth: Payer: Self-pay | Admitting: *Deleted

## 2014-05-30 NOTE — Telephone Encounter (Signed)
Ok to stay on Lantus

## 2014-05-30 NOTE — Telephone Encounter (Signed)
Patient said his insurance will not cover the Toujeo, his copay is over $160, he would prefer to stay on the Lantus.  Please advise

## 2014-05-30 NOTE — Progress Notes (Signed)
Quick Note:  A1c is still high at 10.2. Cholesterol is above target, if not on medications start pravastatin 40 mg daily ______

## 2014-06-25 ENCOUNTER — Ambulatory Visit: Payer: Medicare Other | Admitting: Endocrinology

## 2014-07-08 ENCOUNTER — Ambulatory Visit (INDEPENDENT_AMBULATORY_CARE_PROVIDER_SITE_OTHER): Payer: Medicare Other | Admitting: Endocrinology

## 2014-07-08 ENCOUNTER — Encounter: Payer: Self-pay | Admitting: Endocrinology

## 2014-07-08 VITALS — BP 124/72 | HR 82 | Temp 97.9°F | Resp 14 | Ht 64.0 in | Wt 151.6 lb

## 2014-07-08 DIAGNOSIS — E7800 Pure hypercholesterolemia, unspecified: Secondary | ICD-10-CM

## 2014-07-08 DIAGNOSIS — IMO0002 Reserved for concepts with insufficient information to code with codable children: Secondary | ICD-10-CM

## 2014-07-08 DIAGNOSIS — E1165 Type 2 diabetes mellitus with hyperglycemia: Secondary | ICD-10-CM

## 2014-07-08 NOTE — Patient Instructions (Signed)
10 Humalog before meals; may need 12 with larger meals to keep post meal sugars under 180  Lantus 12 at bedtime and 16 units the time

## 2014-07-08 NOTE — Progress Notes (Signed)
Patient ID: Alex Tucker, male   DOB: 04/26/1971, 43 y.o.   MRN: [id number]    Reason for Appointment: F/u for Type 2 Diabetes  Referring physician: Merilynn Finland  History of Present Illness:          Diagnosis: Type 2 diabetes mellitus, date of diagnosis:  2014      Past history:  He apparently had no symptoms at diagnosis and his blood sugar a year before at his physical was normal Details of his initial diagnosis are not available but he thinks his blood sugars were about 180 at that time with A1c around 9% He was initially tried on metformin but he could not tolerate this because of diarrhea Subsequently on his own he was trying to lose weight with diet and apparently lost 40 pounds around the end of 2014 His PCP started him on insulin in 12/14 approximately when his A1c was high and he was also losing weight.   Recent history: INSULIN regimen is described as:  Lantus 14 twice a day, Humalog 8 units at supper, 2-4 units for high readings  He was on only Levemir and this was increased progressively up to 22 units twice a day prior to his consultation He was told to try Invokana and Victoza along with his basal insulin but he could not get Victoza because of non-coverage by his insurance.  Also he was having side effects of not feeling well and having some nausea with Invokana and stopped this in 8/15 after Also his Levemir was changed to Lantus insulin once a day; initially was started on 20 units His Lantus dose has been adjusted periodically On his visit in 10/15 because of tendency to higher readings midday and lower readings on waking up his Lantus was split to twice a day, 10 units at bedtime and 14 units on his morning dose; he has increased the doses to 14 twice a day HUMALOG insulin: He is now taking 8-10 units before meals and more when the blood sugar is higher Although he thinks his blood sugars are better controlled he still appears to have some hyperglycemia Did not bring his  monitor for download Again his blood sugar patterns are somewhat difficult to evaluate because of his variable sleeping times; still working from about 3 AM until 2 PM; off on Wednesdays and Thursday   Currently appears to have the following blood sugar patterns:  Blood sugars are relatively lower when he wakes up, usually 100-150 especially on his off days  May occasionally have low blood sugars when he is sleeping in the evenings unless he has a bedtime snack  Blood sugars are usually around 200 when he comes back from work   No significant hypoglycemia  His mealtimes and sleeping times are as follows: Working days: He sleeps from 8 PM to 2 AM  and works from 3 AM to about 2 PM On working days his meals are at 7-8 PM, lunch at 9 AM at work and snack at 3 PM On his off days he will sleep at night and eats 2-3 meals at regular times Oral hypoglycemic drugs the patient is taking are: None Side effects from medications have been: Metformin causes diarrhea, Invokana causes nausea and malaise   Glycemic control:  Lab Results  Component Value Date   HGBA1C 10.2* 05/28/2014   HGBA1C 11.2* 02/26/2014   Lab Results  Component Value Date   MICROALBUR 0.1 02/26/2014   LDLCALC 134* 05/28/2014   CREATININE 1.2 04/16/2014  Self-care: The diet that the patient has been following is: tries to limit carbohydrates.   Sleep at 8 pm  and work 2 am to 2 p m  Meals: 3 meals per day. breakfast: granola, lunch 9 am biscuit or sandwich; dinner 7-8 pm hs snack          Exercise: farm          Dietician visit: Most recent: Never.               Compliance with the medical regimen: good Retinal exam: Most recent: 1 year ago.    Weight history: Previous range 145-185  Wt Readings from Last 3 Encounters:  07/08/14 151 lb 9.6 oz (68.765 kg)  05/28/14 152 lb 6.4 oz (69.128 kg)  04/16/14 147 lb 6.4 oz (66.86 kg)      Medication List       This list is accurate as of: 07/08/14  3:23 PM.   Always use your most recent med list.               atenolol 50 MG tablet  Commonly known as:  TENORMIN  Take 100 mg by mouth daily.     citalopram 40 MG tablet  Commonly known as:  CELEXA  Take 40 mg by mouth daily.     HUMALOG KWIKPEN 100 UNIT/ML KiwkPen  Generic drug:  insulin lispro  Inject 8 Units into the skin daily. SS,     LANTUS SOLOSTAR 100 UNIT/ML Solostar Pen  Generic drug:  Insulin Glargine  Apply to eye 2 (two) times daily.     Insulin Glargine 300 UNIT/ML Sopn  Commonly known as:  TOUJEO SOLOSTAR  Inject 26 Units into the skin once.     levETIRAcetam 750 MG tablet  Commonly known as:  KEPPRA  Take 1,500 mg by mouth 2 (two) times daily.     levETIRAcetam 500 MG 24 hr tablet  Commonly known as:  KEPPRA XR  500 mg. 3 tablets in am and 3 tablets in pm     omeprazole 40 MG capsule  Commonly known as:  PRILOSEC     pravastatin 40 MG tablet  Commonly known as:  PRAVACHOL  Take 40 mg by mouth 2 (two) times daily.        Allergies:  Allergies  Allergen Reactions  . Codeine Itching    Past Medical History  Diagnosis Date  . High cholesterol   . TBI (traumatic brain injury)   . Seizures     Past Surgical History  Procedure Laterality Date  . Hand surgery      Family History  Problem Relation Age of Onset  . Diabetes Mother   . Diabetes Paternal Grandmother   . Diabetes Paternal Grandfather     Social History:  reports that he has been smoking.  He has never used smokeless tobacco. He reports that he does not drink alcohol or use illicit drugs.    Review of Systems       Lipids: currently on 40 mg pravastatin for treatment      Lab Results  Component Value Date   CHOL 207* 05/28/2014   HDL 34.20* 05/28/2014   LDLCALC 134* 05/28/2014   TRIG 196.0* 05/28/2014   CHOLHDL 6 05/28/2014      Physical Examination:  BP 124/72 mmHg  Pulse 82  Temp(Src) 97.9 F (36.6 C)  Resp 14  Ht 5\' 4"  (1.626 m)  Wt 151 lb 9.6 oz (68.765 kg)   BMI 26.01  kg/m2  SpO2 96%       ASSESSMENT:  Diabetes, unknown type, uncontrolled   See history of present illness for detailed discussion on his current blood sugar patterns and problems identified His A1c last month was significantly high around 10% His blood sugars are appearing still higher postprandially and when he comes back from work He is doing a little better with splitting the Lantus to twice a day especially with control of overnight blood sugars, has better readings fasting with minimal tendency to hypoglycemia overnight also He still not trying to get enough insulin to cover his meals as postprandial readings are reportedly over 200 often  He continues to be on insulin alone and is likely to be insulin deficient He will probably be needing insulin alone to manage his diabetes in a basal bolus fashion Although he is interested in insulin pump he is concerned about the financial difficulties with trying this  PLAN:   Change Lantus to 16 in the morning and 12 in the evening before he goes to sleep and discussed how to titrate this  Start taking at least 10 units Humalog coverage for his meals Era BumpersAnn Moore for larger meal; plus additional 2-4 units for high readings, will need to adjust the base dose also based on his meal size and carbohydrate intake  More blood sugars after meals  Check A1c in 2 months  He was seen by the nurse educator for detailed information on insulin pumps and out-of-pocket expenses  Will need to discuss increasing lipid-lowering drugs on his next visit  Counseling time over 50% of today's 25 minute visit  There are no Patient Instructions on file for this visit.  Yarianna Varble 07/08/2014, 3:23 PM   Note: This office note was prepared with Dragon voice recognition system technology. Any transcriptional errors that result from this process are unintentional.

## 2014-08-20 ENCOUNTER — Telehealth: Payer: Self-pay | Admitting: Endocrinology

## 2014-08-20 NOTE — Telephone Encounter (Signed)
Appt. Scheduled for 09/08/14 at 8:30AM

## 2014-08-20 NOTE — Telephone Encounter (Signed)
Patient would like for you to call him, he would like to set up and appointment to see linda.

## 2014-08-20 NOTE — Telephone Encounter (Signed)
Bonita QuinLinda, can you call him to set up an appointment?

## 2014-08-25 ENCOUNTER — Other Ambulatory Visit: Payer: Self-pay | Admitting: Endocrinology

## 2014-08-25 DIAGNOSIS — IMO0002 Reserved for concepts with insufficient information to code with codable children: Secondary | ICD-10-CM

## 2014-08-25 DIAGNOSIS — E1165 Type 2 diabetes mellitus with hyperglycemia: Secondary | ICD-10-CM

## 2014-08-28 ENCOUNTER — Encounter: Payer: Medicare Other | Attending: Endocrinology | Admitting: Dietician

## 2014-08-28 ENCOUNTER — Encounter: Payer: Self-pay | Admitting: Dietician

## 2014-08-28 VITALS — Ht 64.0 in | Wt 146.0 lb

## 2014-08-28 DIAGNOSIS — Z794 Long term (current) use of insulin: Secondary | ICD-10-CM | POA: Insufficient documentation

## 2014-08-28 DIAGNOSIS — E1165 Type 2 diabetes mellitus with hyperglycemia: Secondary | ICD-10-CM | POA: Diagnosis not present

## 2014-08-28 DIAGNOSIS — Z713 Dietary counseling and surveillance: Secondary | ICD-10-CM | POA: Insufficient documentation

## 2014-08-28 DIAGNOSIS — IMO0002 Reserved for concepts with insufficient information to code with codable children: Secondary | ICD-10-CM

## 2014-08-28 NOTE — Progress Notes (Signed)
  Medical Nutrition Therapy:  Appt start time: 1400 end time:  1515.   Assessment:  Primary concerns today: Patient to start on pump in 2 weeks.  Needs to learn Carbohydrate counting.  Accompanied by wife.  Wife is a Engineer, civil (consulting)nurse and also has had type DM since her 4520's.  She has been trying to help him with carbohydrate counting.  He states that he is often hungry and has continued to lose weight.  Weight loss thought secondary to uncontrolled DM as well as reduced intake.  Reports weight of 190 lbs 8 months ago.  Usually 3 meals and several snacks daily.  Drinks sugar free beverages.  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  Has been fighting blood sugars of >400.  Hx includes TBI.  Patient states difficulty with short term memory.    Preferred Learning Style:   Hands on  Learning Readiness:   Ready  Change in progress   MEDICATIONS: see list   DIETARY INTAKE:  Usual eating pattern includes 3 meals and several snacks per day.  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  Estimated energy needs: 1800-2000 calories 200-225 g carbohydrates 113-125 g protein 60-67 g fat  Progress Towards Goal(s):  In progress.   Nutritional Diagnosis:  NB-1.1 Food and nutrition-related knowledge deficit As related to carbohydrate counting.  As evidenced by patient report.    Intervention:  Nutrition counseling and diabetes education initiated. Discussed Carb Counting by food group as method of portion control as well as Carb Counting using apps and web resources, reading food labels, and benefits of increased  activity.   Teaching Method Utilized:  Visual Auditory Hands on  Handouts given during visit include:  Carb Counting Card  Low Carb snacks and protein choices  Label reading   Barriers to learning/adherence to lifestyle change: TBI with short term memory problems, stress with new things.  Demonstrated degree of understanding via:  Teach Back   Monitoring/Evaluation:  Dietary intake, exercise, carb counting, and body weight prn.

## 2014-08-31 ENCOUNTER — Encounter (HOSPITAL_COMMUNITY): Payer: Self-pay | Admitting: *Deleted

## 2014-08-31 ENCOUNTER — Emergency Department (HOSPITAL_COMMUNITY)
Admission: EM | Admit: 2014-08-31 | Discharge: 2014-08-31 | Disposition: A | Discharge status: Home / Self Care | Payer: Medicare Other | Attending: Emergency Medicine | Admitting: Emergency Medicine

## 2014-08-31 DIAGNOSIS — K219 Gastro-esophageal reflux disease without esophagitis: Secondary | ICD-10-CM | POA: Insufficient documentation

## 2014-08-31 DIAGNOSIS — Z794 Long term (current) use of insulin: Secondary | ICD-10-CM | POA: Diagnosis not present

## 2014-08-31 DIAGNOSIS — Z87820 Personal history of traumatic brain injury: Secondary | ICD-10-CM | POA: Diagnosis not present

## 2014-08-31 DIAGNOSIS — E780 Pure hypercholesterolemia: Secondary | ICD-10-CM | POA: Diagnosis not present

## 2014-08-31 DIAGNOSIS — E1165 Type 2 diabetes mellitus with hyperglycemia: Secondary | ICD-10-CM | POA: Diagnosis present

## 2014-08-31 DIAGNOSIS — R739 Hyperglycemia, unspecified: Secondary | ICD-10-CM

## 2014-08-31 DIAGNOSIS — Z79899 Other long term (current) drug therapy: Secondary | ICD-10-CM | POA: Insufficient documentation

## 2014-08-31 LAB — CBC WITH DIFFERENTIAL/PLATELET
BASOS PCT: 1 % (ref 0–1)
Basophils Absolute: 0.1 10*3/uL (ref 0.0–0.1)
EOS ABS: 0.3 10*3/uL (ref 0.0–0.7)
EOS PCT: 3 % (ref 0–5)
HEMATOCRIT: 42.9 % (ref 39.0–52.0)
Hemoglobin: 15.5 g/dL (ref 13.0–17.0)
LYMPHS ABS: 4.1 10*3/uL — AB (ref 0.7–4.0)
LYMPHS PCT: 45 % (ref 12–46)
MCH: 31.5 pg (ref 26.0–34.0)
MCHC: 36.1 g/dL — AB (ref 30.0–36.0)
MCV: 87.2 fL (ref 78.0–100.0)
MONO ABS: 1 10*3/uL (ref 0.1–1.0)
MONOS PCT: 11 % (ref 3–12)
NEUTROS PCT: 40 % — AB (ref 43–77)
Neutro Abs: 3.6 10*3/uL (ref 1.7–7.7)
Platelets: 330 10*3/uL (ref 150–400)
RBC: 4.92 MIL/uL (ref 4.22–5.81)
RDW: 12.4 % (ref 11.5–15.5)
WBC: 9 10*3/uL (ref 4.0–10.5)

## 2014-08-31 LAB — COMPREHENSIVE METABOLIC PANEL
ALT: 20 U/L (ref 0–53)
ANION GAP: 8 (ref 5–15)
AST: 22 U/L (ref 0–37)
Albumin: 3.9 g/dL (ref 3.5–5.2)
Alkaline Phosphatase: 64 U/L (ref 39–117)
BUN: 14 mg/dL (ref 6–23)
CHLORIDE: 94 meq/L — AB (ref 96–112)
CO2: 25 mmol/L (ref 19–32)
CREATININE: 0.94 mg/dL (ref 0.50–1.35)
Calcium: 9 mg/dL (ref 8.4–10.5)
GFR calc non Af Amer: >90 mL/min (ref >90)
GLUCOSE: 438 mg/dL — AB (ref 70–99)
Potassium: 3.6 mmol/L (ref 3.5–5.1)
Sodium: 127 mmol/L — ABNORMAL LOW (ref 135–145)
TOTAL PROTEIN: 7 g/dL (ref 6.0–8.3)
Total Bilirubin: 0.2 mg/dL — ABNORMAL LOW (ref 0.3–1.2)

## 2014-08-31 LAB — CBG MONITORING, ED
GLUCOSE-CAPILLARY: 354 mg/dL — AB (ref 70–99)
GLUCOSE-CAPILLARY: 332 mg/dL — AB (ref 70–99)
Glucose-Capillary: 182 mg/dL — ABNORMAL HIGH (ref 70–99)

## 2014-08-31 LAB — URINALYSIS, ROUTINE W REFLEX MICROSCOPIC
BILIRUBIN URINE: NEGATIVE
Glucose, UA: >1000 mg/dL — AB
HGB URINE DIPSTICK: NEGATIVE
Ketones, ur: NEGATIVE mg/dL
LEUKOCYTES UA: NEGATIVE
NITRITE: NEGATIVE
PROTEIN: NEGATIVE mg/dL
Urobilinogen, UA: 0.2 mg/dL (ref 0.0–1.0)
pH: 5.5 (ref 5.0–8.0)

## 2014-08-31 LAB — URINE MICROSCOPIC-ADD ON

## 2014-08-31 MED ORDER — SODIUM CHLORIDE 0.9 % IV BOLUS (SEPSIS)
1000.0000 mL | Freq: Once | INTRAVENOUS | Status: AC
  Administered 2014-08-31: 1000 mL via INTRAVENOUS

## 2014-08-31 MED ORDER — SODIUM CHLORIDE 0.9 % IV BOLUS (SEPSIS)
500.0000 mL | Freq: Once | INTRAVENOUS | Status: AC
  Administered 2014-08-31: 500 mL via INTRAVENOUS

## 2014-08-31 MED ORDER — INSULIN ASPART 100 UNIT/ML IV SOLN
8.0000 [IU] | Freq: Once | INTRAVENOUS | Status: AC
  Administered 2014-08-31: 8 [IU] via INTRAVENOUS

## 2014-08-31 NOTE — ED Notes (Signed)
Pt c/o having high CBG readings all day; last reading 582

## 2014-08-31 NOTE — ED Notes (Signed)
Pt alert & oriented x4, stable gait. Patient given discharge instructions, paperwork & prescription(s). Patient  instructed to stop at the registration desk to finish any additional paperwork. Patient verbalized understanding. Pt left department w/ no further questions. 

## 2014-08-31 NOTE — ED Provider Notes (Signed)
CSN: [id number]     Arrival date & time 08/31/14  [id number] History   First MD Initiated Contact with Patient 08/31/14 [phone]     Chief Complaint  Patient presents with  . Hyperglycemia     (Consider location/radiation/quality/duration/timing/severity/associated sxs/prior Treatment) HPI Comments: Patient is a 44 year old male who presents with complaints of elevated blood sugar. He is a known diabetic who is been having erratic blood sugars for the past several weeks, worse the past few days. He has been seen by his endocrinologist and is to start an insulin pump sometime this month. Today he noticed his sugar was 582 and did not improve much with insulin at home. He presents for evaluation of this. He does report some thirst and increased urination but denies any fevers, chills, chest pain, abdominal pain, or other symptoms.  Patient is a 44 y.o. male presenting with hyperglycemia. The history is provided by the patient.  Hyperglycemia Blood sugar level PTA:  582 Severity:  Moderate Onset quality:  Gradual Duration:  3 days Timing:  Constant Progression:  Worsening Chronicity:  New Diabetes status:  Controlled with insulin Context: not change in medication   Relieved by:  Nothing Ineffective treatments:  None tried   Past Medical History  Diagnosis Date  . High cholesterol   . TBI (traumatic brain injury)   . Seizures   . Diabetes mellitus without complication   . GERD (gastroesophageal reflux disease)    Past Surgical History  Procedure Laterality Date  . Hand surgery     Family History  Problem Relation Age of Onset  . Diabetes Mother   . Diabetes Paternal Grandmother   . Diabetes Paternal Grandfather    History  Substance Use Topics  . Smoking status: Current Every Day Smoker -- 0.50 packs/day  . Smokeless tobacco: Never Used  . Alcohol Use: No    Review of Systems  All other systems reviewed and are negative.     Allergies  Codeine  Home Medications    Prior to Admission medications   Medication Sig Start Date End Date Taking? Authorizing Provider  atenolol (TENORMIN) 50 MG tablet Take 100 mg by mouth daily.     Historical Provider, MD  citalopram (CELEXA) 40 MG tablet Take 40 mg by mouth daily.    Historical Provider, MD  Insulin Glargine (LANTUS SOLOSTAR) 100 UNIT/ML Solostar Pen Apply to eye 2 (two) times daily.    Historical Provider, MD  Insulin Glargine (TOUJEO SOLOSTAR) 300 UNIT/ML SOPN Inject 26 Units into the skin once. Patient not taking: Reported on 08/28/2014 05/28/14   Reather LittlerAjay Kumar, MD  insulin lispro (HUMALOG KWIKPEN) 100 UNIT/ML KiwkPen Inject 8 Units into the skin daily. SS,    Historical Provider, MD  levETIRAcetam (KEPPRA XR) 500 MG 24 hr tablet 500 mg. 3 tablets in am and 3 tablets in pm 05/26/14   Historical Provider, MD  levETIRAcetam (KEPPRA) 750 MG tablet Take 2,000 mg by mouth 2 (two) times daily.     Historical Provider, MD  omeprazole (PRILOSEC) 40 MG capsule  05/26/14   Historical Provider, MD  pravastatin (PRAVACHOL) 40 MG tablet Take 40 mg by mouth 2 (two) times daily.     Historical Provider, MD   BP 108/68 mmHg  Pulse 68  Temp(Src) 98 F (36.7 C) (Oral)  Resp 20  Ht 5\' 4"  (1.626 m)  Wt 150 lb (68.04 kg)  BMI 25.73 kg/m2  SpO2 98% Physical Exam  Constitutional: He is oriented to person, place, and time. He  appears well-developed and well-nourished. No distress.  HENT:  Head: Normocephalic and atraumatic.  Mouth/Throat: Oropharynx is clear and moist.  Neck: Normal range of motion. Neck supple.  Cardiovascular: Normal rate and regular rhythm.   No murmur heard. Pulmonary/Chest: Effort normal and breath sounds normal. No respiratory distress. He has no wheezes.  Abdominal: Soft. Bowel sounds are normal. He exhibits no distension. There is no tenderness.  Musculoskeletal: Normal range of motion. He exhibits no edema.  Neurological: He is alert and oriented to person, place, and time.  Skin: Skin is warm and  dry. He is not diaphoretic.  Nursing note and vitals reviewed.   ED Course  Procedures (including critical care time) Labs Review Labs Reviewed  URINALYSIS, ROUTINE W REFLEX MICROSCOPIC - Abnormal; Notable for the following:    Specific Gravity, Urine <1.005 (*)    Glucose, UA >1000 (*)    All other components within normal limits  CBC WITH DIFFERENTIAL - Abnormal; Notable for the following:    MCHC 36.1 (*)    Neutrophils Relative % 40 (*)    Lymphs Abs 4.1 (*)    All other components within normal limits  COMPREHENSIVE METABOLIC PANEL - Abnormal; Notable for the following:    Sodium 127 (*)    Chloride 94 (*)    Glucose, Bld 438 (*)    Total Bilirubin 0.2 (*)    All other components within normal limits  CBG MONITORING, ED - Abnormal; Notable for the following:    Glucose-Capillary 354 (*)    All other components within normal limits  CBG MONITORING, ED - Abnormal; Notable for the following:    Glucose-Capillary 332 (*)    All other components within normal limits  URINE MICROSCOPIC-ADD ON    Imaging Review No results found.   EKG Interpretation None      MDM   Final diagnoses:  None    Patient presents with complaints of elevated blood sugar.  He was given IVF's and novolog and sugars are now 186.  There is no evidence for DKA in the electrolytes. He will be discharged home with instructions to follow-up with his endocrinologist and return as needed for any problems.    Geoffery Lyons, MD 08/31/14 [phone]

## 2014-08-31 NOTE — Discharge Instructions (Signed)
Continue your insulin as before. Keep a record of your blood sugar results you can take with you to your endocrinologist at your follow-up appointment. Ideally this should be within the next week.   Hyperglycemia Hyperglycemia occurs when the glucose (sugar) in your blood is too high. Hyperglycemia can happen for many reasons, but it most often happens to people who do not know they have diabetes or are not managing their diabetes properly.  CAUSES  Whether you have diabetes or not, there are other causes of hyperglycemia. Hyperglycemia can occur when you have diabetes, but it can also occur in other situations that you might not be as aware of, such as: Diabetes  If you have diabetes and are having problems controlling your blood glucose, hyperglycemia could occur because of some of the following reasons:  Not following your meal plan.  Not taking your diabetes medications or not taking it properly.  Exercising less or doing less activity than you normally do.  Being sick. Pre-diabetes  This cannot be ignored. Before people develop Type 2 diabetes, they almost always have "pre-diabetes." This is when your blood glucose levels are higher than normal, but not yet high enough to be diagnosed as diabetes. Research has shown that some long-term damage to the body, especially the heart and circulatory system, may already be occurring during pre-diabetes. If you take action to manage your blood glucose when you have pre-diabetes, you may delay or prevent Type 2 diabetes from developing. Stress  If you have diabetes, you may be "diet" controlled or on oral medications or insulin to control your diabetes. However, you may find that your blood glucose is higher than usual in the hospital whether you have diabetes or not. This is often referred to as "stress hyperglycemia." Stress can elevate your blood glucose. This happens because of hormones put out by the body during times of stress. If stress has  been the cause of your high blood glucose, it can be followed regularly by your caregiver. That way he/she can make sure your hyperglycemia does not continue to get worse or progress to diabetes. Steroids  Steroids are medications that act on the infection fighting system (immune system) to block inflammation or infection. One side effect can be a rise in blood glucose. Most people can produce enough extra insulin to allow for this rise, but for those who cannot, steroids make blood glucose levels go even higher. It is not unusual for steroid treatments to "uncover" diabetes that is developing. It is not always possible to determine if the hyperglycemia will go away after the steroids are stopped. A special blood test called an A1c is sometimes done to determine if your blood glucose was elevated before the steroids were started. SYMPTOMS  Thirsty.  Frequent urination.  Dry mouth.  Blurred vision.  Tired or fatigue.  Weakness.  Sleepy.  Tingling in feet or leg. DIAGNOSIS  Diagnosis is made by monitoring blood glucose in one or all of the following ways:  A1c test. This is a chemical found in your blood.  Fingerstick blood glucose monitoring.  Laboratory results. TREATMENT  First, knowing the cause of the hyperglycemia is important before the hyperglycemia can be treated. Treatment may include, but is not be limited to:  Education.  Change or adjustment in medications.  Change or adjustment in meal plan.  Treatment for an illness, infection, etc.  More frequent blood glucose monitoring.  Change in exercise plan.  Decreasing or stopping steroids.  Lifestyle changes. HOME CARE  INSTRUCTIONS   Test your blood glucose as directed.  Exercise regularly. Your caregiver will give you instructions about exercise. Pre-diabetes or diabetes which comes on with stress is helped by exercising.  Eat wholesome, balanced meals. Eat often and at regular, fixed times. Your caregiver  or nutritionist will give you a meal plan to guide your sugar intake.  Being at an ideal weight is important. If needed, losing as little as 10 to 15 pounds may help improve blood glucose levels. SEEK MEDICAL CARE IF:   You have questions about medicine, activity, or diet.  You continue to have symptoms (problems such as increased thirst, urination, or weight gain). SEEK IMMEDIATE MEDICAL CARE IF:   You are vomiting or have diarrhea.  Your breath smells fruity.  You are breathing faster or slower.  You are very sleepy or incoherent.  You have numbness, tingling, or pain in your feet or hands.  You have chest pain.  Your symptoms get worse even though you have been following your caregiver's orders.  If you have any other questions or concerns. Document Released: 02/01/2001 Document Revised: 10/31/2011 Document Reviewed: 12/05/2011 [facility] Patient Information 2015 [employer] [place]. This information is not intended to replace advice given to you by your health care provider. Make sure you discuss any questions you have with your health care provider.

## 2014-08-31 NOTE — ED Notes (Signed)
Pt states blood sugar has been up today. Pt has head ache. Was over 500 at home took 10 units of humalog & after 1 hour had gone up. Pt is to be placed on insulin pump due to blood sugars staying elevated. Wife states averaging in the 300's

## 2014-09-08 ENCOUNTER — Encounter: Payer: Medicare Other | Admitting: Nutrition

## 2014-09-08 ENCOUNTER — Ambulatory Visit (INDEPENDENT_AMBULATORY_CARE_PROVIDER_SITE_OTHER): Payer: Medicare Other | Admitting: Endocrinology

## 2014-09-08 DIAGNOSIS — E1165 Type 2 diabetes mellitus with hyperglycemia: Secondary | ICD-10-CM

## 2014-09-08 DIAGNOSIS — IMO0002 Reserved for concepts with insufficient information to code with codable children: Secondary | ICD-10-CM

## 2014-09-08 NOTE — Patient Instructions (Signed)
As on pump log sheet

## 2014-09-08 NOTE — Progress Notes (Signed)
Mr. Alex Tucker and his wife were instructed on the Medtronic 723 insulin pump.  He did very little pre pump training.  He was using the Bayer contour meter, but no prepump training was done, despite my encouraging him to do this last week.  We reviewd the difference between basal and bolus insulins.  He put in the settings of date/time, basal rates, carb settings and all other settings with some assistance from me.  He made changes to the basal rates and carb settings after seeing Dr. Lucianne MussKumar with very little assistance from me. He was shown how to give a bolus, using carbs eaten.  He reports good understanding of how to do this, re demonstarted this with very little assistance from me.  His meter was connected to his pump and he did a correction dose of 9.1u at 10AM due to a blood sugars of 465.  He did not take his long acting insulin this AM, nor did he take his Humalog before breakfast today.  He filled a cartridge and infusion set (MIO 6mm) with some assistance from me,and inserted it into his upper let abdomen without any pain or discomfort.   He was instructed to test his blood sugars ac , 2hr.pc, HS and 3 AM;.  He was given a sheet to record these readings. I will call him tonight to see how he has done.  He was told to call if blood sugars don't come down, or he drops low before 5PM today, and he agreed to do this.   They had no final questions.

## 2014-09-08 NOTE — Progress Notes (Signed)
Patient ID: Alex Tucker, male   DOB: 07/04/1971, 44 y.o.   MRN: [id number]    Reason for Appointment: F/u for Type 2 Diabetes  Referring physician: Merilynn Finlandobertson  History of Present Illness:          Diagnosis: Type 2 diabetes mellitus, date of diagnosis:  2014      Past history:  He apparently had no symptoms at diagnosis and his blood sugar a year before at his physical was normal Details of his initial diagnosis are not available but he thinks his blood sugars were about 180 at that time with A1c around 9% He was initially tried on metformin but he could not tolerate this because of diarrhea Subsequently on his own he was trying to lose weight with diet and apparently lost 40 pounds around the end of 2014 His PCP started him on insulin in 12/14 approximately when his A1c was high and he was also losing weight. His initial consultation he was told to try Invokana and Victoza along with his basal insulin but he could not get Victoza because of non-coverage by his insurance.  Also he was having side effects of not feeling well and having some nausea with Invokana and stopped this in 8/15 after Also his Levemir was changed to Lantus insulin once a day; initially was started on 20 units   Recent history: INSULIN regimen is described as:  Lantus 15 twice a day, Humalog 6 units before meals, correction factor 2:50  His Lantus dose has been adjusted periodically And on the last visit he was told to take larger doses in the morning and less in the evening but he is still taking the same amount twice a day.  He was getting relatively high postprandial readings and was advised to increase his mealtime coverage but he did not do so. His blood sugars have been much higher in the last few weeks and this is partly from his not working.  Also he had an episode of a seizure in December.  He does not know why his blood sugars are high He does state tired and gets thirsty.  Does usually avoid drinks with  sugar He was seen on the 10th of this month in the ER because of high readings and given IV fluids, was not having any features of ketosis  Currently he is not working and will not be until next month  He is starting an insulin pump, Medtronic brand on 08/29/14 Blood sugar this morning is 465, he did not take any insulin last evening or this morning Is being given instructions on the pump today by nurse educator Also has had consultation with dietitian and is informed about carbohydrate counting now  Currently appears to have the following blood sugar patterns:  Blood sugars are averaging about 240 in the morning with only one good reading of 117 preceded by a glucose of 370 the night before.  Blood sugars are mostly over 300 the rest of the day with about 3 or 4 good readings in the afternoons  Blood sugars are much higher later at night  Overall blood sugar is averaging 337 with the highest reading 582   His mealtimes and sleeping times while working weres follows: Working days: He sleeps from 8 PM to 2 AM  and works from 3 AM to about 2 PM On working days his meals are at 7-8 PM, lunch at 9 AM at work and snack at 3 PM On his off days he will  sleep at night and eats 2-3 meals at regular times Oral hypoglycemic drugs the patient is taking are: None Side effects from medications have been: Metformin causes diarrhea, Invokana causes nausea and malaise   Glycemic control:  Lab Results  Component Value Date   HGBA1C 10.2* 05/28/2014   HGBA1C 11.2* 02/26/2014   Lab Results  Component Value Date   MICROALBUR 0.1 02/26/2014   LDLCALC 134* 05/28/2014   CREATININE 0.94 08/31/2014    Self-care: The diet that the patient has been following is: none, currently not counting carbohydrates   Sleep at 8 pm  and work 2 am to 2 p m  Meals: 3 meals per day. breakfast: granola, lunch 9 am biscuit or sandwich; dinner 7-8 pm hs snack          Exercise: farm          Dietician visit: Most  recent: Never.               Compliance with the medical regimen: good Retinal exam: Most recent: 1 year ago.    Weight history: Previous range 145-185  Wt Readings from Last 3 Encounters:  08/31/14 150 lb (68.04 kg)  08/28/14 146 lb (66.225 kg)  07/08/14 151 lb 9.6 oz (68.765 kg)      Medication List       This list is accurate as of: 09/08/14 10:00 AM.  Always use your most recent med list.               atenolol 50 MG tablet  Commonly known as:  TENORMIN  Take 100 mg by mouth daily.     citalopram 40 MG tablet  Commonly known as:  CELEXA  Take 40 mg by mouth daily.     HUMALOG KWIKPEN 100 UNIT/ML KiwkPen  Generic drug:  insulin lispro  Inject 8 Units into the skin daily. SS,     LANTUS SOLOSTAR 100 UNIT/ML Solostar Pen  Generic drug:  Insulin Glargine  Apply to eye 2 (two) times daily.     Insulin Glargine 300 UNIT/ML Sopn  Commonly known as:  TOUJEO SOLOSTAR  Inject 26 Units into the skin once.     levETIRAcetam 750 MG tablet  Commonly known as:  KEPPRA  Take 2,000 mg by mouth 2 (two) times daily.     levETIRAcetam 500 MG 24 hr tablet  Commonly known as:  KEPPRA XR  500 mg. 3 tablets in am and 3 tablets in pm     omeprazole 40 MG capsule  Commonly known as:  PRILOSEC     pravastatin 40 MG tablet  Commonly known as:  PRAVACHOL  Take 40 mg by mouth 2 (two) times daily.        Allergies:  Allergies  Allergen Reactions  . Codeine Itching    Past Medical History  Diagnosis Date  . High cholesterol   . TBI (traumatic brain injury)   . Seizures   . Diabetes mellitus without complication   . GERD (gastroesophageal reflux disease)     Past Surgical History  Procedure Laterality Date  . Hand surgery      Family History  Problem Relation Age of Onset  . Diabetes Mother   . Diabetes Paternal Grandmother   . Diabetes Paternal Grandfather     Social History:  reports that he has been smoking.  He has never used smokeless tobacco. He reports  that he does not drink alcohol or use illicit drugs.    Review of Systems  Lipids: currently on 40 mg pravastatin for treatment      Lab Results  Component Value Date   CHOL 207* 05/28/2014   HDL 34.20* 05/28/2014   LDLCALC 134* 05/28/2014   TRIG 196.0* 05/28/2014   CHOLHDL 6 05/28/2014      Physical Examination:  There were no vitals taken for this visit.       ASSESSMENT:  Diabetes, unknown type, uncontrolled   See history of present illness for review of his current blood sugar patterns and management His blood sugars have been quite out of control even without significant change in his insulin dose or other factors Some of his hyperglycemia may be related to inactivity and also stress of his seizure and not working He should benefit from his pump especially since his normal work routine involves variable working hours and mealtimes  PLAN:   Start with a basal rate of 1.0 at midnight, 1.2 at 7 AM, 1.0 at 1 PM and 1.3 at 7 PM  Correction factor 1: 30 with target 120  Carbohydrate ratio 1:7  Call if blood sugars are unusually high or low this evening  Follow-up tomorrow.  He will check blood sugars as directed before and 2 hours after meals and 3 AM   Alex Tucker 09/08/2014, 10:00 AM   Note: This office note was prepared with Insurance underwriter. Any transcriptional errors that result from this process are unintentional.

## 2014-09-09 ENCOUNTER — Telehealth: Payer: Self-pay | Admitting: Nutrition

## 2014-09-09 ENCOUNTER — Encounter: Payer: Medicare Other | Admitting: Nutrition

## 2014-09-09 DIAGNOSIS — IMO0002 Reserved for concepts with insufficient information to code with codable children: Secondary | ICD-10-CM

## 2014-09-09 DIAGNOSIS — E1165 Type 2 diabetes mellitus with hyperglycemia: Secondary | ICD-10-CM

## 2014-09-09 NOTE — Telephone Encounter (Signed)
Blood sugars Date              acB      2hr. pcB     acL               2hr.pcL                             acS    2hr. pcS        HS        3AM 1/18                                               228(6.1u)   178(5PM)  61(6:30PM)     132 *      253*                        238       * did not count carbs correctly under 30carbs. 1/19               173        117           126              197  Basal rate:  MN: 1.0, 7AM: 1.2,   1PM, 1.0,   7PM: 1.3          I/C 7, sensitivity is 30  Target 120

## 2014-09-09 NOTE — Telephone Encounter (Signed)
Midnight basal rate = 1.1, otherwise no change

## 2014-09-10 ENCOUNTER — Other Ambulatory Visit: Payer: Self-pay | Admitting: *Deleted

## 2014-09-10 ENCOUNTER — Encounter: Payer: Medicare Other | Admitting: Nutrition

## 2014-09-10 ENCOUNTER — Ambulatory Visit (INDEPENDENT_AMBULATORY_CARE_PROVIDER_SITE_OTHER): Payer: Medicare Other | Admitting: Endocrinology

## 2014-09-10 ENCOUNTER — Encounter: Payer: Self-pay | Admitting: Endocrinology

## 2014-09-10 VITALS — BP 122/64 | HR 66 | Temp 97.9°F | Resp 12 | Wt 151.0 lb

## 2014-09-10 DIAGNOSIS — IMO0002 Reserved for concepts with insufficient information to code with codable children: Secondary | ICD-10-CM

## 2014-09-10 DIAGNOSIS — E1165 Type 2 diabetes mellitus with hyperglycemia: Secondary | ICD-10-CM

## 2014-09-10 MED ORDER — GLUCAGON (RDNA) 1 MG IJ KIT: 1.0000 mg | PACK | Freq: Once | INTRAMUSCULAR | Status: DC | PRN

## 2014-09-10 MED ORDER — INSULIN LISPRO 100 UNIT/ML ~~LOC~~ SOLN: SUBCUTANEOUS | Status: DC

## 2014-09-10 NOTE — Progress Notes (Signed)
Patient here with wife again today.  Reports no difficulty giving boluses, sleeping with pump, or disconnectingreconnecting infusion set when showering. He made the changes to his basal rate as per dr. Remus BlakeKumar's order:  increasing his MN basal rate by 0.1u/hr   We reviewed when and how to use the manual bolus, the combination bolus, and the extended bolus.  They reported good understanding of this. We also reviewed when and how to use the temp basal rates.  He re demonstrated this appropriately and had no final questions.   We will do a cartridge and infusion set change tomorrow, and he was told to bring the supplies for this. We also discussed sick days, and emergency supples to carry with him at all times.  He was given a handout for these topics as well.

## 2014-09-10 NOTE — Progress Notes (Signed)
Patient made changes to basal rates, and bolus settings with very little assistance from me.  He did a cartridge and infusion set change with some assistance from me.  He was encouraged to review the steps for this in the manual, before his next cartridge change inh 3 days.  He agreed to do this.    We reviewed when/how to do temp basal rates, and sick day guidelines.  We also covered high blood sugar protocols.  He was given a handout on all of the above topics, and he had no questions about any of them.    He was told to continue to test ac, 2hr. pc hs and call readings on Friday.  He agreed to do this.    He reviewed all topics on the training checklist, and he signed off on understanding all topics with no final questions.  He was encouraged to signed up for [facility], and told to read over the booklet in his starter kit for this.

## 2014-09-10 NOTE — Patient Instructions (Signed)
Carb cover 1: 6 ansd 6 pm basal 1.45

## 2014-09-10 NOTE — Progress Notes (Signed)
Patient ID: Alex Tucker, male   DOB: [birth year], 44 y.o.   MRN: [id number]    Reason for Appointment: F/u for Type 2 Diabetes  Referring physician: Merilynn Finland  History of Present Illness:          Diagnosis: Type 2 diabetes mellitus, date of diagnosis:  2014      Past history:  He apparently had no symptoms at diagnosis and his blood sugar a year before at his physical was normal Details of his initial diagnosis are not available but he thinks his blood sugars were about 180 at that time with A1c around 9% He was initially tried on metformin but he could not tolerate this because of diarrhea Subsequently on his own he was trying to lose weight with diet and apparently lost 40 pounds around the end of 2014 His PCP started him on insulin in 12/14 approximately when his A1c was high and he was also losing weight. His initial consultation he was told to try Invokana and Victoza along with his basal insulin but he could not get Victoza because of non-coverage by his insurance.  Also he was having side effects of not feeling well and having some nausea with Invokana and stopped this in 8/15 after Also his Levemir was changed to Lantus insulin once a day; initially was started on 20 units   Recent history:  He  started an insulin pump, [employer] brand on 08/29/14  His basal rate was increased after his first day and yesterday he was told to increase to midnight basal rate to 1.1 since fasting readings were high He has done well with glucose monitoring several times a day His activity level has been a little variable and may have been more on Monday when the sugars were better in the afternoon He has a over his pump once or twice daily Blood sugar readings over the last 2 days are as follows:  Fasting glucose today was 117 although yesterday 173  Blood sugar after breakfast was relatively higher at 192 today  Blood sugar also appears to be relatively higher after lunch and dinner; however he  thinks last night he had a relatively higher fat meal and blood sugars stayed up until 2:45 AM over 200  Blood sugar before supper was 73 on Monday but 243 yesterday  No hypoglycemia Currently he is not working and will not be until next month   Is being given instructions on the pump today by nurse educator Also has had consultation with dietitian and is informed about carbohydrate counting now  His mealtimes and sleeping times while working weres follows: Working days: He sleeps from 8 PM to 2 AM  and works from 3 AM to about 2 PM On working days his meals are at 7-8 PM, lunch at 9 AM at work and snack at 3 PM On his off days he will sleep at night and eats 2-3 meals at regular times Oral hypoglycemic drugs the patient is taking are: None Side effects from medications have been: Metformin causes diarrhea, Invokana causes nausea and malaise   Glycemic control:  Lab Results  Component Value Date   HGBA1C 10.2* 05/28/2014   HGBA1C 11.2* 02/26/2014   Lab Results  Component Value Date   MICROALBUR 0.1 02/26/2014   LDLCALC 134* 05/28/2014   CREATININE 0.94 08/31/2014    Self-care: The diet that the patient has been following is: none, currently not counting carbohydrates   Sleep at 8 pm  and work 2 am  to 2 p m  Meals: 3 meals per day. breakfast: granola, lunch 9 am biscuit or sandwich; dinner 7-8 pm hs snack          Exercise: farm          Dietician visit: Most recent: Never.               Compliance with the medical regimen: good Retinal exam: Most recent: 1 year ago.    Weight history: Previous range 145-185  Wt Readings from Last 3 Encounters:  09/10/14 151 lb (68.493 kg)  08/31/14 150 lb (68.04 kg)  08/28/14 146 lb (66.225 kg)      Medication List       This list is accurate as of: 09/10/14  1:15 PM.  Always use your most recent med list.               atenolol 50 MG tablet  Commonly known as:  TENORMIN  Take 100 mg by mouth daily.     citalopram 40 MG  tablet  Commonly known as:  CELEXA  Take 40 mg by mouth daily.     HUMALOG KWIKPEN 100 UNIT/ML KiwkPen  Generic drug:  insulin lispro  Inject 8 Units into the skin daily. SS,     LANTUS SOLOSTAR 100 UNIT/ML Solostar Pen  Generic drug:  Insulin Glargine  Apply to eye 2 (two) times daily.     Insulin Glargine 300 UNIT/ML Sopn  Commonly known as:  TOUJEO SOLOSTAR  Inject 26 Units into the skin once.     levETIRAcetam 750 MG tablet  Commonly known as:  KEPPRA  Take 2,000 mg by mouth 2 (two) times daily.     levETIRAcetam 500 MG 24 hr tablet  Commonly known as:  KEPPRA XR  500 mg. 3 tablets in am and 3 tablets in pm     omeprazole 40 MG capsule  Commonly known as:  PRILOSEC     pravastatin 40 MG tablet  Commonly known as:  PRAVACHOL  Take 40 mg by mouth 2 (two) times daily.        Allergies:  Allergies  Allergen Reactions  . Codeine Itching    Past Medical History  Diagnosis Date  . High cholesterol   . TBI (traumatic brain injury)   . Seizures   . Diabetes mellitus without complication   . GERD (gastroesophageal reflux disease)     Past Surgical History  Procedure Laterality Date  . Hand surgery      Family History  Problem Relation Age of Onset  . Diabetes Mother   . Diabetes Paternal Grandmother   . Diabetes Paternal Grandfather     Social History:  reports that he has been smoking.  He has never used smokeless tobacco. He reports that he does not drink alcohol or use illicit drugs.    Review of Systems       Lipids: currently on 40 mg pravastatin for treatment      Lab Results  Component Value Date   CHOL 207* 05/28/2014   HDL 34.20* 05/28/2014   LDLCALC 134* 05/28/2014   TRIG 196.0* 05/28/2014   CHOLHDL 6 05/28/2014      Physical Examination:  BP 122/64 mmHg  Pulse 66  Temp(Src) 97.9 F (36.6 C) (Oral)  Resp 12  Wt 151 lb (68.493 kg)  SpO2 95%    not indicated    ASSESSMENT:  Diabetes, unknown type, uncontrolled   See  history of present illness for review of  his current blood sugar patterns and management His blood sugars have been steadily improving with starting insulin pump and he feels subjectively much better also His blood sugars are relatively higher after meals and occasionally this could be related to inadequate coverage of the meals especially if higher fat Blood sugar before supper time is somewhat inconsistent but blood sugars tend to be higher later in the evening Fasting reading today is improved with increasing overnight basal rate  PLAN:   Basal rate at 6 PM = 1.45  Correction factor as before 1: 30 with target 120  Carbohydrate ratio 1: 6  Follow-up by phone tomorrow.   Jodeci Rini 09/10/2014, 1:15 PM   Note: This office note was prepared with Dragon voice recognition system technology. Any transcriptional errors that result from this process are unintentional.

## 2014-09-10 NOTE — Patient Instructions (Signed)
Test blood sugars before meals, 2hr. After meals, bedtime, and 3AM Give boluses before all meals and snacks, unless it is to treat a low blood sugar

## 2014-09-10 NOTE — Telephone Encounter (Signed)
Pump basal rate changed per Dr. Remus BlakeKumar's order

## 2014-09-10 NOTE — Patient Instructions (Signed)
Continue to test blood sugars before meals, and 2 hours after, bedtime and 3AM.

## 2014-09-11 ENCOUNTER — Ambulatory Visit: Payer: Medicare Other | Admitting: Endocrinology

## 2014-09-11 ENCOUNTER — Telehealth: Payer: Self-pay | Admitting: Endocrinology

## 2014-09-11 ENCOUNTER — Other Ambulatory Visit: Payer: Self-pay

## 2014-09-11 NOTE — Telephone Encounter (Signed)
See below and please advise, Thanks!  

## 2014-09-11 NOTE — Telephone Encounter (Signed)
Blood sugar readings   Wed 6:46am      222 14 units          9:18           218          11:26         213  Thur  7:00am     113  Breakfast 11 units          11:00         212           1:00          193           2:30          212

## 2014-09-11 NOTE — Telephone Encounter (Signed)
Increase all basal rates frow 7 am to MN by 0.2

## 2014-09-11 NOTE — Telephone Encounter (Signed)
Pt advised of note below and voiced understanding.  

## 2014-09-12 ENCOUNTER — Ambulatory Visit: Payer: Medicare Other | Admitting: Endocrinology

## 2014-09-15 ENCOUNTER — Other Ambulatory Visit: Payer: Self-pay | Admitting: *Deleted

## 2014-09-15 MED ORDER — GLUCAGON (RDNA) 1 MG IJ KIT: 1.0000 mg | PACK | Freq: Once | INTRAMUSCULAR | Status: DC | PRN

## 2014-09-15 NOTE — Patient Instructions (Signed)
Continue to test blood sugars before meals, 2 hours after meals, and bedtime. Call readings on Friday. Review topics of cartridge/infusion set changes and call [employer] helpline if quesitons.

## 2014-09-17 NOTE — Telephone Encounter (Signed)
Chart opened by mistake

## 2014-10-06 ENCOUNTER — Ambulatory Visit: Payer: Medicare Other | Admitting: Endocrinology

## 2014-10-06 ENCOUNTER — Telehealth: Payer: Self-pay | Admitting: Endocrinology

## 2014-10-06 NOTE — Telephone Encounter (Signed)
Patient no showed today's appt. Please advise on how to follow up. °A. No follow up necessary. °B. Follow up urgent. Contact patient immediately. °C. Follow up necessary. Contact patient and schedule visit in ___ days. °D. Follow up advised. Contact patient and schedule visit in ____weeks. ° °

## 2014-10-12 NOTE — Telephone Encounter (Signed)
Follow up necessary. Contact patient and schedule visit asap 

## 2014-10-22 NOTE — Telephone Encounter (Signed)
Needs to be seen for follow-up appointment

## 2014-10-28 NOTE — Telephone Encounter (Signed)
Lm to call back so we can reschedule

## 2014-10-31 ENCOUNTER — Encounter: Payer: Self-pay | Admitting: Endocrinology

## 2014-10-31 ENCOUNTER — Ambulatory Visit (INDEPENDENT_AMBULATORY_CARE_PROVIDER_SITE_OTHER): Payer: Medicare Other | Admitting: Endocrinology

## 2014-10-31 VITALS — BP 113/68 | HR 62 | Temp 98.1°F | Resp 14 | Ht 64.0 in | Wt 162.4 lb

## 2014-10-31 DIAGNOSIS — IMO0002 Reserved for concepts with insufficient information to code with codable children: Secondary | ICD-10-CM

## 2014-10-31 DIAGNOSIS — E7800 Pure hypercholesterolemia, unspecified: Secondary | ICD-10-CM

## 2014-10-31 DIAGNOSIS — E1165 Type 2 diabetes mellitus with hyperglycemia: Secondary | ICD-10-CM

## 2014-10-31 LAB — BASIC METABOLIC PANEL
BUN: 15 mg/dL (ref 6–23)
CHLORIDE: 106 meq/L (ref 96–112)
CO2: 30 mEq/L (ref 19–32)
Calcium: 9.1 mg/dL (ref 8.4–10.5)
Creatinine, Ser: 1.11 mg/dL (ref 0.40–1.50)
GFR: 76.87 mL/min (ref >60.00)
Glucose, Bld: 70 mg/dL (ref 70–99)
POTASSIUM: 4.3 meq/L (ref 3.5–5.1)
Sodium: 137 mEq/L (ref 135–145)

## 2014-10-31 LAB — MICROALBUMIN / CREATININE URINE RATIO
Creatinine,U: 160 mg/dL
MICROALB/CREAT RATIO: 0.4 mg/g (ref 0.0–30.0)
Microalb, Ur: <0.7 mg/dL (ref 0.0–1.9)

## 2014-10-31 LAB — HEMOGLOBIN A1C: Hgb A1c MFr Bld: 8.8 % — ABNORMAL HIGH (ref 4.6–6.5)

## 2014-10-31 LAB — LDL CHOLESTEROL, DIRECT: Direct LDL: 122 mg/dL

## 2014-10-31 NOTE — Patient Instructions (Signed)
More sugars 2 hrs after meals, target 120-160; may need to adjust Carb ratio  Temp basal for being very active of 50%

## 2014-10-31 NOTE — Progress Notes (Signed)
Patient ID: Alex Tucker, male   DOB: [birth year], 44 y.o.   MRN: [id number]    Reason for Appointment: F/u for Type 2 Diabetes  Referring physician: Merilynn Finland  History of Present Illness:          Diagnosis: Type 2 diabetes mellitus, date of diagnosis:  2014      Past history:  He apparently had no symptoms at diagnosis and his blood sugar a year before at his physical was normal Details of his initial diagnosis are not available but he thinks his blood sugars were about 180 at that time with A1c around 9% He was initially tried on metformin but he could not tolerate this because of diarrhea Subsequently on his own he was trying to lose weight with diet and apparently lost 40 pounds around the end of 2014 His PCP started him on insulin in 12/14 approximately when his A1c was high and he was also losing weight. His initial consultation he was told to try Invokana and Victoza along with his basal insulin but he could not get Victoza because of non-coverage by his insurance.  Also he was having side effects of not feeling well and having some nausea with Invokana and stopped this in 8/15 after Also his Levemir was changed to Lantus insulin once a day; initially was started on 20 units   Recent history:  He  started an insulin pump, [employer] brand on 08/29/14 He is now here for a 6 week follow-up. He still not working, previously had been working at night shifts He has not been checking his blood sugars as frequently as before and infrequently on some days  Blood sugar readings over the last 2 weeks show the following patterns and problems identified are as follows:  Fasting glucose has been generally good with one low blood sugar  He has had one low reading overnight but not since 2/26  Blood sugars are relatively lower around lunchtime also with not consistent; probably lowest on average at lunchtime around 1-2 PM  Blood sugars are mostly increased at times at suppertime but not clear  if he is checking his blood sugar after eating since blood sugars are preceded by boluses  He may sometimes have a snack in the late afternoon and this may raise his blood sugar although he is bolusing for all snacks  Has infrequent readings after supper and difficult to identify his POSTPRANDIAL control at other meals also  A low blood sugars have been less than the last week but otherwise had sporadic low readings at all times except before supper  He has 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  PRE-MEAL Breakfast Lunch Dinner  PCS  Overall  Glucose range:  58-150   64-187   96-238  65-375   Mean/median:  100     180   143+/-80     His mealtimes and sleeping times while working were as follows: Working days: He sleeps from 8 PM to 2 AM  and works from 3 AM to about 2 PM On working days his meals are at 7-8 PM, lunch at 9 AM at work and snack at 3 PM On his off days he will sleep at night and eats 2-3 meals at regular times Oral hypoglycemic drugs the patient is taking are: None Side effects from medications have been: Metformin causes diarrhea, Invokana causes nausea and malaise   Glycemic control:  Lab Results  Component Value  Date   HGBA1C 10.2* 05/28/2014   HGBA1C 11.2* 02/26/2014   Lab Results  Component Value Date   MICROALBUR 0.1 02/26/2014   LDLCALC 134* 05/28/2014   CREATININE 0.94 08/31/2014    Self-care: The diet that the patient has been following is:  Meals: 3 meals per day. breakfast: granola, lunch 9 am biscuit or sandwich; dinner 7-8 pm hs snack          Exercise: withfarming          Dietician visit: Most recent:1/16               Compliance with the medical regimen: good Retinal exam: Most recent: 1 year ago.    Weight history: Previous range 145-185  Wt Readings from Last 3 Encounters:  10/31/14 162 lb 6.4 oz (73.664 kg)  09/10/14 151 lb (68.493 kg)  08/31/14 150 lb (68.04 kg)      Medication List        This list is accurate as of: 10/31/14  8:42 AM.  Always use your most recent med list.               atenolol 50 MG tablet  Commonly known as:  TENORMIN  Take 100 mg by mouth daily.     citalopram 40 MG tablet  Commonly known as:  CELEXA  Take 40 mg by mouth daily.     glucagon 1 MG injection  Commonly known as:  GLUCAGON EMERGENCY  Inject 1 mg into the vein once as needed.     LANTUS SOLOSTAR 100 UNIT/ML Solostar Pen  Generic drug:  Insulin Glargine  Apply to eye 2 (two) times daily.     Insulin Glargine 300 UNIT/ML Sopn  Commonly known as:  TOUJEO SOLOSTAR  Inject 26 Units into the skin once.     insulin lispro 100 UNIT/ML injection  Commonly known as:  HUMALOG  Use 64 units daily in insulin pump.     insulin lispro 100 UNIT/ML injection  Commonly known as:  HUMALOG  Inject into the skin.     levETIRAcetam 1000 MG tablet  Commonly known as:  KEPPRA  Take 2,000 mg by mouth 2 (two) times daily.     omeprazole 40 MG capsule  Commonly known as:  PRILOSEC     pravastatin 40 MG tablet  Commonly known as:  PRAVACHOL  Take 40 mg by mouth 2 (two) times daily.        Allergies:  Allergies  Allergen Reactions  . Codeine Itching    Past Medical History  Diagnosis Date  . High cholesterol   . TBI (traumatic brain injury)   . Seizures   . Diabetes mellitus without complication   . GERD (gastroesophageal reflux disease)     Past Surgical History  Procedure Laterality Date  . Hand surgery      Family History  Problem Relation Age of Onset  . Diabetes Mother   . Diabetes Paternal Grandmother   . Diabetes Paternal Grandfather     Social History:  reports that he has been smoking.  He has never used smokeless tobacco. He reports that he does not drink alcohol or use illicit drugs.    Review of Systems       Lipids: currently on 40 mg pravastatin for treatment and followed by PCP      Lab Results  Component Value Date   CHOL 207* 05/28/2014    HDL 34.20* 05/28/2014   LDLCALC 134* 05/28/2014   TRIG 196.0*  05/28/2014   CHOLHDL 6 05/28/2014    History of depression  Followed by neurologist for seizure disorder and is out of work for this   Physical Examination:  BP 113/68 mmHg  Pulse 62  Temp(Src) 98.1 F (36.7 C)  Resp 14  Ht  (1.626 m)  Wt 162 lb 6.4 oz (73.664 kg)  BMI 27.86 kg/m2  SpO2 97%    not indicated    ASSESSMENT:  Diabetes, unknown type, uncontrolled   See history of present illness for review of his current blood sugar patterns, problems identified  And pump  management His blood sugars have been overall fairly good with his home readings averaging about 143 for the last 2 weeks or However he does have some variability in his blood sugars especially in the evenings Also not consistently checking his blood sugars on each day Most likely he needs to check blood sugar before each meal consistently and also at least once a day after meals to help assess his mealtime control   He has had fairly good blood sugar control during the day and now because of his increasing activity is starting to get low readings around lunchtime   PLAN:   Basal rate at  11 AM = 1.0 and 6 PM = 1.5  Consider continuous glucose monitoring  May try temporary basal for increased exercise and this was discussed  He may need extra coverage for higher fat meals in the evenings  Call if blood sugars consistently high before supper again  Counseling time over 50% of today's 25 minute visit  Bijon Mineer 10/31/2014, 8:42 AM   Note: This office note was prepared with Dragon voice recognition system technology. Any transcriptional errors that result from this process are unintentional.   Addendum: Labs as follows   A1c 8.8, cholesterol still high needs to change pravastatin to Lipitor 20 mg daily  Office Visit on 10/31/2014  Component Date Value Ref Range Status  . Hgb A1c MFr Bld 10/31/2014 8.8* 4.6 - 6.5 % Final    Glycemic Control Guidelines for People with Diabetes:Non Diabetic:  <6%Goal of Therapy: <7%Additional Action Suggested:  >8%   . Sodium 10/31/2014 137  135 - 145 mEq/L Final  . Potassium 10/31/2014 4.3  3.5 - 5.1 mEq/L Final  . Chloride 10/31/2014 106  96 - 112 mEq/L Final  . CO2 10/31/2014 30  19 - 32 mEq/L Final  . Glucose, Bld 10/31/2014 70  70 - 99 mg/dL Final  . BUN 21/30/8657 15  6 - 23 mg/dL Final  . Creatinine, Ser 10/31/2014 1.11  0.40 - 1.50 mg/dL Final  . Calcium 84/69/6295 9.1  8.4 - 10.5 mg/dL Final  . GFR 28/41/3244 76.87  >60.00 mL/min Final  . Direct LDL 10/31/2014 122.0   Final   Optimal:  <100 mg/dLNear or Above Optimal:  100-129 mg/dLBorderline High:  130-159 mg/dLHigh:  160-189 mg/dLVery High:  >190 mg/dL  . Microalb, Ur 10/31/2014 <0.7  0.0 - 1.9 mg/dL Final  . Creatinine,U 08/24/7251 160.0   Final  . Microalb Creat Ratio 10/31/2014 0.4  0.0 - 30.0 mg/g Final

## 2014-11-02 NOTE — Progress Notes (Signed)
Quick Note:  Please let patient know that A1c 8.8, cholesterol still high needs to change pravastatin to Lipitor 20 mg daily   ______

## 2014-11-03 ENCOUNTER — Other Ambulatory Visit: Payer: Self-pay | Admitting: *Deleted

## 2014-11-03 MED ORDER — ATORVASTATIN CALCIUM 20 MG PO TABS: 20.0000 mg | ORAL_TABLET | Freq: Every day | ORAL | Status: DC

## 2015-01-01 ENCOUNTER — Ambulatory Visit: Payer: Medicare Other | Admitting: Endocrinology

## 2015-01-09 ENCOUNTER — Ambulatory Visit: Payer: Medicare Other | Admitting: Endocrinology

## 2015-03-02 LAB — HEMOGLOBIN A1C: Hgb A1c MFr Bld: 8.3 % — AB (ref 4.0–6.0)

## 2015-03-26 ENCOUNTER — Ambulatory Visit (INDEPENDENT_AMBULATORY_CARE_PROVIDER_SITE_OTHER): Payer: Medicare Other | Admitting: Endocrinology

## 2015-03-26 ENCOUNTER — Encounter: Payer: Self-pay | Admitting: *Deleted

## 2015-03-26 ENCOUNTER — Encounter: Payer: Self-pay | Admitting: Endocrinology

## 2015-03-26 VITALS — BP 118/72 | HR 69 | Temp 97.9°F | Resp 14 | Ht 64.0 in | Wt 163.6 lb

## 2015-03-26 DIAGNOSIS — E1165 Type 2 diabetes mellitus with hyperglycemia: Secondary | ICD-10-CM

## 2015-03-26 DIAGNOSIS — IMO0002 Reserved for concepts with insufficient information to code with codable children: Secondary | ICD-10-CM

## 2015-03-26 DIAGNOSIS — E7800 Pure hypercholesterolemia, unspecified: Secondary | ICD-10-CM

## 2015-03-26 LAB — BASIC METABOLIC PANEL
BUN: 15 mg/dL (ref 6–23)
CALCIUM: 9.2 mg/dL (ref 8.4–10.5)
CO2: 27 meq/L (ref 19–32)
Chloride: 101 mEq/L (ref 96–112)
Creatinine, Ser: 1.07 mg/dL (ref 0.40–1.50)
GFR: 80.04 mL/min (ref >60.00)
GLUCOSE: 178 mg/dL — AB (ref 70–99)
POTASSIUM: 4 meq/L (ref 3.5–5.1)
Sodium: 135 mEq/L (ref 135–145)

## 2015-03-26 LAB — LDL CHOLESTEROL, DIRECT: LDL DIRECT: 148 mg/dL

## 2015-03-26 LAB — LIPID PANEL
CHOL/HDL RATIO: 6
CHOLESTEROL: 209 mg/dL — AB (ref 0–200)
HDL: 35.1 mg/dL — ABNORMAL LOW (ref >39.00)
NonHDL: 173.53
TRIGLYCERIDES: 227 mg/dL — AB (ref 0.0–149.0)
VLDL: 45.4 mg/dL — AB (ref 0.0–40.0)

## 2015-03-26 NOTE — Progress Notes (Signed)
Patient ID: Alex Tucker, male   DOB: June 13, 1971, 44 y.o.   MRN: [id number]    Reason for Appointment: F/u for Type 2 Diabetes  Referring physician: Merilynn Finland  History of Present Illness:          Diagnosis: Type 2 diabetes mellitus, date of diagnosis:  2014      Past history:  He apparently had no symptoms at diagnosis and his blood sugar a year before at his physical was normal Details of his initial diagnosis are not available but he thinks his blood sugars were about 180 at that time with A1c around 9% He was initially tried on metformin but he could not tolerate this because of diarrhea Subsequently on his own he was trying to lose weight with diet and apparently lost 40 pounds around the end of 2014 His PCP started him on insulin in 12/14 approximately when his A1c was high and he was also losing weight. His initial consultation he was told to try Invokana and Victoza along with his basal insulin but he could not get Victoza because of non-coverage by his insurance.  Also he was having side effects of not feeling well and having some nausea with Invokana and stopped this in 8/15 after Also his Levemir was changed to Lantus insulin once a day; initially was started on 20 units   Recent history:  He  started an insulin pump, Medtronic brand on 08/29/14  PUMP settings: Basal rate at midnight = 1.05.  7 AM = 1.4, 11 AM = 1.0 and 6 PM = 1.5.  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  He subjectively feels better with starting the pump  Blood sugar readings over the last 2 weeks show the  following patterns and problems identified are as follows:  Fasting glucose is usually not checked as he frequently does not eat breakfast but appeared to be mostly high  He has had occasional very low sugars overnight including last night despite not working last evening on increased exercise  Blood sugars after his breakfast meal may tend to be lower but he is often not bolusing in the mornings  He tends to have marked increase in blood sugar after lunch periodically.  He is not consistent in covering higher fat meals with extra insulin and does not use any extended boluses.  Blood sugars around supper time are generally mildly increased but this may be partly because he is suspending his pump when he is very active at work  He has sporadic high readings early morning also  He has difficulty getting his infusion set to stick on when he is working and sweating but is using a skin prep before applying the infusion set with  good success  PRE-MEAL Fasting Lunch Dinner Bedtime Overall  Glucose range:  187-327   57-220   62-303   86-264   178+/-80   Mean/median:   157   162      POST-MEAL PC Breakfast PC Lunch PC Dinner  Glucose range:  67  167-360    Mean/median:  Oral hypoglycemic drugs the patient is taking are: None Side effects from medications have been: Metformin causes diarrhea, Invokana causes nausea and malaise   Glycemic control:  Lab Results  Component Value Date   HGBA1C 8.8* 10/31/2014   HGBA1C 10.2* 05/28/2014   HGBA1C 11.2* 02/26/2014   Lab Results  Component Value Date   MICROALBUR <0.7 10/31/2014   LDLCALC 134* 05/28/2014   CREATININE 1.11 10/31/2014    Self-care: The diet that the patient has been following is:  Meals: 3 meals per day. breakfast: granola, lunch 9 am biscuit or sandwich; dinner 7-8 pm hs snack          Exercise: withfarming          Dietician visit: Most recent:1/16               Compliance with the medical regimen: good Retinal  exam: Most recent: 1 year ago.    Weight history: Previous range 145-185  Wt Readings from Last 3 Encounters:  03/26/15 163 lb 9.6 oz (74.208 kg)  10/31/14 162 lb 6.4 oz (73.664 kg)  09/10/14 151 lb (68.493 kg)      Medication List       This list is accurate as of: 03/26/15  9:04 AM.  Always use your most recent med list.               atenolol 50 MG tablet  Commonly known as:  TENORMIN  Take 100 mg by mouth daily.     atorvastatin 20 MG tablet  Commonly known as:  LIPITOR  Take 1 tablet (20 mg total) by mouth daily.     citalopram 40 MG tablet  Commonly known as:  CELEXA  Take 40 mg by mouth daily.     glucagon 1 MG injection  Commonly known as:  GLUCAGON EMERGENCY  Inject 1 mg into the vein once as needed.     insulin lispro 100 UNIT/ML injection  Commonly known as:  HUMALOG  Use 64 units daily in insulin pump.     levETIRAcetam 1000 MG tablet  Commonly known as:  KEPPRA  Take 2,000 mg by mouth 2 (two) times daily.     omeprazole 40 MG capsule  Commonly known as:  PRILOSEC     pravastatin 40 MG tablet  Commonly known as:  PRAVACHOL  Take 40 mg by mouth 2 (two) times daily.        Allergies:  Allergies  Allergen Reactions  . Codeine Itching    Past Medical History  Diagnosis Date  . High cholesterol   . TBI (traumatic brain injury)   . Seizures   . Diabetes mellitus without complication   . GERD (gastroesophageal reflux disease)     Past Surgical History  Procedure Laterality Date  . Hand surgery      Family History  Problem Relation Age of Onset  . Diabetes Mother   . Diabetes Paternal Grandmother   . Diabetes Paternal Grandfather     Social History:  reports that he has been smoking.  He has never used smokeless tobacco. He reports that he does not drink alcohol or use illicit drugs.    Review of Systems       Lipids: Still on 40 mg pravastatin for treatment of mixed hyperlipidemia but his last levels were high.  He was supposed  to change to Lipitor but he did not do so because of cost and being in the donut hole      Lab  Results  Component Value Date   CHOL 207* 05/28/2014   HDL 34.20* 05/28/2014   LDLCALC 134* 05/28/2014   LDLDIRECT 122.0 10/31/2014   TRIG 196.0* 05/28/2014   CHOLHDL 6 05/28/2014    History of depression treated with Celexa    Physical Examination:  BP 118/72 mmHg  Pulse 69  Temp(Src) 97.9 F (36.6 C)  Resp 14  Ht 5\' 4"  (1.626 m)  Wt 163 lb 9.6 oz (74.208 kg)  BMI 28.07 kg/m2  SpO2 97%    not indicated    ASSESSMENT:  Diabetes, insulin-dependent   See history of present illness for review of his current blood sugar patterns, problems identified  And pump  management His blood sugars have been overall fairly good with his home readings averaging about 143 for the last 2 weeks or However he does have some variability in his blood sugars especially in the evenings Also not consistently checking his blood sugars on each day Most likely he needs to check blood sugar before each meal consistently and also at least once a day after meals to help assess his mealtime control   He has had fairly good blood sugar control during the day and now because of his increasing activity is starting to get low readings around lunchtime   PLAN:   Basal rate at midnight = 0.95, 5 AM = 1.2, 7 AM = 1.45 11 AM = 1.0 and 6 PM = 1.5  More blood sugars after meals especially after evening meal  Consider continuous glucose monitoring and he will check with insurance again  He may need extra coverage for higher fat meals and extend the bolus 1-2 hours  Increase carbohydrate coverage for lunch to 1:5 and reduce it to 7 at breakfast  Keep glucose tablets when very active to treat hypoglycemia  Call if blood sugars getting low consistently  Check lipids again today, needs to be on a moderately intensive regimen of statin because of his level of hypercholesterolemia previously  Counseling time on  subjects discussed above is over 50% of today's 25 minute visit   Juliah Scadden 03/26/2015, 9:04 AM   Note: This office note was prepared with Insurance underwriter. Any transcriptional errors that result from this process are unintentional.

## 2015-03-29 NOTE — Progress Notes (Signed)
Quick Note:  Please let patient know that the cholesterol is too high and needs to switch to Lipitor, fax to PCP  ______

## 2015-04-01 ENCOUNTER — Other Ambulatory Visit: Payer: Self-pay | Admitting: *Deleted

## 2015-04-01 MED ORDER — ATORVASTATIN CALCIUM 40 MG PO TABS: 40.0000 mg | ORAL_TABLET | Freq: Every day | ORAL | Status: DC

## 2015-04-13 ENCOUNTER — Telehealth: Payer: Self-pay | Admitting: Endocrinology

## 2015-04-13 NOTE — Telephone Encounter (Signed)
Please start to send rx to [facility] in [place] so they can file it under part b

## 2015-04-13 NOTE — Telephone Encounter (Signed)
Added [facility] pharmacy to pt pharmacy list.

## 2015-04-23 ENCOUNTER — Telehealth: Payer: Self-pay | Admitting: Endocrinology

## 2015-04-23 ENCOUNTER — Other Ambulatory Visit: Payer: Self-pay | Admitting: *Deleted

## 2015-04-23 MED ORDER — ATORVASTATIN CALCIUM 40 MG PO TABS: 40.0000 mg | ORAL_TABLET | Freq: Every day | ORAL | Status: DC

## 2015-04-23 MED ORDER — GLUCAGON (RDNA) 1 MG IJ KIT: 1.0000 mg | PACK | Freq: Once | INTRAMUSCULAR | Status: DC | PRN

## 2015-04-23 MED ORDER — INSULIN LISPRO 100 UNIT/ML ~~LOC~~ SOLN: SUBCUTANEOUS | Status: DC

## 2015-04-23 NOTE — Telephone Encounter (Signed)
rx sent

## 2015-04-23 NOTE — Telephone Encounter (Signed)
Patient called stating that he would like all of his Rx's sent to [facility] in [place]    Thank you

## 2015-04-24 ENCOUNTER — Other Ambulatory Visit: Payer: Self-pay | Admitting: *Deleted

## 2015-04-24 MED ORDER — INSULIN LISPRO 100 UNIT/ML ~~LOC~~ SOLN: SUBCUTANEOUS | Status: DC

## 2015-05-06 ENCOUNTER — Other Ambulatory Visit: Payer: Self-pay | Admitting: *Deleted

## 2015-05-20 ENCOUNTER — Other Ambulatory Visit: Payer: Self-pay | Admitting: *Deleted

## 2015-05-20 MED ORDER — GLUCAGON HCL (RDNA) 1 MG IJ SOLR: 1.0000 mg | Freq: Once | INTRAMUSCULAR | Status: DC | PRN

## 2015-05-22 ENCOUNTER — Telehealth: Payer: Self-pay | Admitting: Endocrinology

## 2015-05-22 NOTE — Telephone Encounter (Signed)
Pump settings emailed per patient request.

## 2015-05-22 NOTE — Telephone Encounter (Signed)
Pt has had some odd readings with his pump can we email him the settings so he can verify they are right [email or web address]

## 2015-06-26 ENCOUNTER — Other Ambulatory Visit (INDEPENDENT_AMBULATORY_CARE_PROVIDER_SITE_OTHER): Payer: Medicare Other

## 2015-06-26 ENCOUNTER — Ambulatory Visit (INDEPENDENT_AMBULATORY_CARE_PROVIDER_SITE_OTHER): Payer: Medicare Other | Admitting: Endocrinology

## 2015-06-26 VITALS — BP 120/72 | HR 73 | Temp 98.2°F | Resp 14 | Ht 64.0 in | Wt 169.2 lb

## 2015-06-26 DIAGNOSIS — E1165 Type 2 diabetes mellitus with hyperglycemia: Secondary | ICD-10-CM

## 2015-06-26 DIAGNOSIS — IMO0002 Reserved for concepts with insufficient information to code with codable children: Secondary | ICD-10-CM

## 2015-06-26 DIAGNOSIS — E7800 Pure hypercholesterolemia, unspecified: Secondary | ICD-10-CM

## 2015-06-26 DIAGNOSIS — Z794 Long term (current) use of insulin: Secondary | ICD-10-CM | POA: Diagnosis not present

## 2015-06-26 LAB — COMPREHENSIVE METABOLIC PANEL
ALBUMIN: 3.9 g/dL (ref 3.5–5.2)
ALT: 23 U/L (ref 0–53)
AST: 19 U/L (ref 0–37)
Alkaline Phosphatase: 85 U/L (ref 39–117)
BUN: 20 mg/dL (ref 6–23)
CO2: 29 mEq/L (ref 19–32)
CREATININE: 1.14 mg/dL (ref 0.40–1.50)
Calcium: 9.6 mg/dL (ref 8.4–10.5)
Chloride: 100 mEq/L (ref 96–112)
GFR: 74.31 mL/min (ref >60.00)
Glucose, Bld: 232 mg/dL — ABNORMAL HIGH (ref 70–99)
Potassium: 3.9 mEq/L (ref 3.5–5.1)
SODIUM: 134 meq/L — AB (ref 135–145)
TOTAL PROTEIN: 7.2 g/dL (ref 6.0–8.3)
Total Bilirubin: 0.3 mg/dL (ref 0.2–1.2)

## 2015-06-26 LAB — LIPID PANEL
CHOL/HDL RATIO: 5
CHOLESTEROL: 179 mg/dL (ref 0–200)
HDL: 38.8 mg/dL — ABNORMAL LOW (ref >39.00)
LDL CALC: 108 mg/dL — AB (ref 0–99)
NonHDL: 140.1
Triglycerides: 162 mg/dL — ABNORMAL HIGH (ref 0.0–149.0)
VLDL: 32.4 mg/dL (ref 0.0–40.0)

## 2015-06-26 LAB — POCT GLYCOSYLATED HEMOGLOBIN (HGB A1C): HEMOGLOBIN A1C: 8.3

## 2015-06-26 NOTE — Progress Notes (Signed)
Patient ID: Alex Tucker, male   DOB: 09/27/1970, 44 y.o.   MRN: [id number]    Reason for Appointment: F/u for Type 2 Diabetes  Referring physician: Merilynn Finland  History of Present Illness:          Diagnosis: Type 2 diabetes mellitus, date of diagnosis:  2014      Past history:  He apparently had no symptoms at diagnosis and his blood sugar a year before at his physical was normal Details of his initial diagnosis are not available but he thinks his blood sugars were about 180 at that time with A1c around 9% He was initially tried on metformin but he could not tolerate this because of diarrhea Subsequently on his own he was trying to lose weight with diet and apparently lost 40 pounds around the end of 2014 His PCP started him on insulin in 12/14 approximately when his A1c was high and he was also losing weight. His initial consultation he was told to try Invokana and Victoza along with his basal insulin but he could not get Victoza because of non-coverage by his insurance.  Also he was having side effects of not feeling well and having some nausea with Invokana and stopped this in 8/15 after Also his Levemir was changed to Lantus insulin once a day; initially was started on 20 units   Recent history:   He  started an insulin pump, Medtronic brand on 08/29/14 because of poor control with basal bolus insulin regimens;  prior to going on the pump A1c was over 10%  PUMP settings:Basal rate at midnight = 1.05 , 5 AM = 1.2, 7 AM = 1.45 11 AM = 1.0 and 6 PM = 1.5  Carbohydrate coverage 1:7 until 12 noon, 1:5 until 6 PM and then 1:6 with sensitivity 1:25 and target 120 Active insulin 4 hours  He has still overall difficulty controlling his blood sugars adequately with A1c again 8.3%. 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  He says that despite disconnecting his pump when working and being very active he may still need to eat to treat mild hypoglycemia   HIGHEST blood sugars are after his first meal at about 10 AM with readings high until about 4 PM  Blood sugars are relatively better before supper and only rarely high late in the evening  Post postprandial blood sugars after supper are variable; he gives himself about the lowest carbohydrate coverage in the mornings and higher in the afternoon when he is not eating consistently  Occasionally he has issues with his infusion set causing significantly high sugars  Overall still has marked variability with standard deviation 96  HYPOGLYCEMIA has been documented in the early hours of the morning, occasionally between 5 PM-11 PM with lowest blood sugar 44 at about 6 PM  Mean values apply above for all meters except median for One Touch  PRE-MEAL Fasting Lunch Dinner Bedtime Overall  Glucose range: 65-254  44-290    Mean/median:     175+/-96   POST-MEAL PC Breakfast PC Lunch PC Dinner  Glucose range: 111-315  55-333  Mean/median:       Oral hypoglycemic drugs the patient is taking are: None Side effects from medications have been: Metformin causes diarrhea, Invokana causes nausea and malaise   Glycemic control:  Lab Results  Component Value Date   HGBA1C 8.3 06/26/2015   HGBA1C 8.3* 03/02/2015   HGBA1C 8.8* 10/31/2014   Lab Results  Component Value Date   MICROALBUR <0.7 10/31/2014   LDLCALC 108* 06/26/2015   CREATININE 1.14 06/26/2015    Self-care: The  diet that the patient has been following [id number] low fat  Meals: 3 meals per day. 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  Always use your most recent med list.               atenolol 50 MG tablet  Commonly known as:  TENORMIN  Take 100 mg by mouth daily.     atorvastatin 40 MG tablet  Commonly known as:  LIPITOR  Take 1 tablet (40 mg total) by mouth daily.     citalopram 40 MG tablet  Commonly known as:  CELEXA  Take 40 mg by mouth daily.     glucagon 1 MG Solr injection  Commonly known as:  GLUCAGEN HYPOKIT  Inject 1 mg into the vein once as needed for low blood sugar.     insulin lispro 100 UNIT/ML injection  Commonly known as:  HUMALOG  Use 64 units daily in insulin pump. Dx code E11.65     levETIRAcetam 1000 MG tablet  Commonly known as:  KEPPRA  Take 2,000 mg by mouth 2 (two) times daily.     omeprazole 40 MG capsule  Commonly known as:  PRILOSEC        Allergies:  Allergies  Allergen Reactions  . Codeine Itching    Past Medical History  Diagnosis Date  . High cholesterol   . TBI (traumatic brain injury)   . Seizures   . Diabetes mellitus without complication   . GERD (gastroesophageal reflux disease)     Past Surgical History  Procedure Laterality Date  . Hand surgery      Family History  Problem Relation Age of Onset  . Diabetes Mother   . Diabetes Paternal Grandmother   . Diabetes Paternal Grandfather     Social History:  reports that he has been smoking.  He has never  used smokeless tobacco. He reports that he does not drink alcohol or use illicit drugs.    Review of Systems       Lipids: on 40 mg Lipitor for treatment of mixed hyperlipidemia and is treated by  PCP Previously was on pravastatin and cholesterol improving HDL is still high      Lab Results  Component Value Date   CHOL 179 06/26/2015   HDL 38.80* 06/26/2015   LDLCALC 108* 06/26/2015   LDLDIRECT 148.0 03/26/2015   TRIG 162.0* 06/26/2015   CHOLHDL 5 06/26/2015    History of depression treated with Celexa   Physical Examination:  BP 120/72 mmHg  Pulse 73  Temp(Src) 98.2 F (36.8 C)  Resp 14  Ht 5\' 4"  (1.626 m)  Wt 169 lb 3.2 oz (76.749 kg)  BMI 29.03 kg/m2  SpO2 95%    not indicated    ASSESSMENT:  Diabetes, insulin-dependent   See history of present illness for review of his current blood sugar patterns, problems identified  And pump  management His blood sugars have been still quite variable but no consistent pattern; usually has blood sugars ranging from 44-400  He has done somewhat better with checking his blood sugars fairly frequently  He has difficulty getting consistent control because of variable activity level and tendency to significant hypoglycemia when he is physically active at work His hypoglycemia he may tend to be carrying over into the night after his work He also has mostly high blood sugars after his first meal around 10-11 AM Hypoglycemia occurs as above in early morning or occasionally only with correcting a high reading Hyperglycemia: This is either after meals as above or occasionally from inadequate coverage for higher fat meals as well as on occasion disconnecting his pump for too long or else forgetting to bolus right at meal time  Day-to-day management discussed with patient in detail covering all these issues  Hyperlipidemia: Now on Lipitor and lipids to be checked today   PLAN:   Basal rate unchanged  Carbohydrate ratio at  breakfast to be 1:5 instead of 1:7  Use a temporary basal of about 60-70% overnight after working to avoid low sugars  Extra insulin for higher fat meals and extended bolus  Call if blood sugar is not well controlled.  Counseling time on subjects discussed above is over 50% of today's 25 minute visit   Slade Pierpoint 06/28/2015, 3:13 PM   Note: This office note was prepared with Insurance underwriterDragon voice recognition system technology. Any transcriptional errors that result from this process are unintentional.

## 2015-06-28 ENCOUNTER — Encounter: Payer: Self-pay | Admitting: Endocrinology

## 2015-07-06 ENCOUNTER — Telehealth: Payer: Self-pay | Admitting: Endocrinology

## 2015-07-06 NOTE — Telephone Encounter (Signed)
Patient reports that he is doing Humalog q 4 hours with basal rate X4hr plus bolusing ac meals at the I/C ratio that he is using in his pump.  Says his blood sugars are "actually very good".   I offered him some reservoirs and infusion sets.  He says that [employer] should deliver them this afternoon. If not, he will come and pick them up

## 2015-07-06 NOTE — Telephone Encounter (Signed)
Bonita QuinLinda,  Can you help him with this please?

## 2015-07-06 NOTE — Telephone Encounter (Signed)
Patient Name: Alex Tucker Gender: Unknown DOB: 10/16/1970 Age: 44 Y 7 M 9 D Return Phone Number: [phone] (Primary) Address: [place] [address] Client [facility] Night - Client Client Site [facility] Physician Reather LittlerKumar, Ajay Contact Type Call Call Type Triage / Clinical Relationship To Patient Self Return Phone Number [phone] (Primary) Chief Complaint Medication Question (non symptomatic) Initial Comment Caller states that infusion set was delivered to the wrong place. Will be out by tomorrow. Will have to give self direct shots and needs to know how to do this. Nurse Assessment Nurse: Para Marchaoud, RN, Gavin Poundeborah Date/Time Lamount Cohen(Eastern Time): 07/05/2015 12:07:48 AM Confirm and document reason for call. If symptomatic, describe symptoms. ---Caller states he did not get his infusion set did not get delivered so he needs to know how to give himself insulin injectionsdosage amounts when his pump runs out. Has humalog insulin and syringes

## 2015-07-13 ENCOUNTER — Telehealth: Payer: Self-pay | Admitting: Endocrinology

## 2015-07-13 ENCOUNTER — Other Ambulatory Visit: Payer: Self-pay | Admitting: *Deleted

## 2015-07-13 MED ORDER — INSULIN LISPRO 100 UNIT/ML ~~LOC~~ SOLN: SUBCUTANEOUS | Status: DC

## 2015-07-13 NOTE — Telephone Encounter (Signed)
rx sent for increased dose

## 2015-07-13 NOTE — Telephone Encounter (Signed)
Insurance is stating the humalog rx is to be refilled 2 days to early can we write a new script for him to get it he is currently using 50-80 u daily.

## 2015-08-27 ENCOUNTER — Ambulatory Visit: Payer: Medicare Other | Admitting: Endocrinology

## 2015-09-09 ENCOUNTER — Other Ambulatory Visit (INDEPENDENT_AMBULATORY_CARE_PROVIDER_SITE_OTHER): Payer: Medicare Other

## 2015-09-09 ENCOUNTER — Other Ambulatory Visit: Payer: Self-pay | Admitting: *Deleted

## 2015-09-09 DIAGNOSIS — IMO0001 Reserved for inherently not codable concepts without codable children: Secondary | ICD-10-CM

## 2015-09-09 DIAGNOSIS — Z794 Long term (current) use of insulin: Secondary | ICD-10-CM

## 2015-09-09 DIAGNOSIS — E1165 Type 2 diabetes mellitus with hyperglycemia: Secondary | ICD-10-CM | POA: Diagnosis not present

## 2015-09-09 LAB — BASIC METABOLIC PANEL
BUN: 17 mg/dL (ref 6–23)
CHLORIDE: 101 meq/L (ref 96–112)
CO2: 30 mEq/L (ref 19–32)
Calcium: 9.1 mg/dL (ref 8.4–10.5)
Creatinine, Ser: 1.13 mg/dL (ref 0.40–1.50)
GFR: 75 mL/min (ref >60.00)
GLUCOSE: 167 mg/dL — AB (ref 70–99)
POTASSIUM: 4.3 meq/L (ref 3.5–5.1)
Sodium: 137 mEq/L (ref 135–145)

## 2015-09-09 LAB — LIPID PANEL
Cholesterol: 190 mg/dL (ref 0–200)
HDL: 32.6 mg/dL — ABNORMAL LOW (ref >39.00)
LDL CALC: 129 mg/dL — AB (ref 0–99)
NonHDL: 157.77
Total CHOL/HDL Ratio: 6
Triglycerides: 146 mg/dL (ref 0.0–149.0)
VLDL: 29.2 mg/dL (ref 0.0–40.0)

## 2015-09-10 ENCOUNTER — Encounter: Payer: Self-pay | Admitting: Endocrinology

## 2015-09-10 ENCOUNTER — Ambulatory Visit (INDEPENDENT_AMBULATORY_CARE_PROVIDER_SITE_OTHER): Payer: Medicare Other | Admitting: Endocrinology

## 2015-09-10 VITALS — BP 110/60 | HR 70 | Temp 98.0°F | Resp 14 | Ht 64.0 in | Wt 167.2 lb

## 2015-09-10 DIAGNOSIS — E7800 Pure hypercholesterolemia, unspecified: Secondary | ICD-10-CM | POA: Diagnosis not present

## 2015-09-10 DIAGNOSIS — Z794 Long term (current) use of insulin: Secondary | ICD-10-CM | POA: Diagnosis not present

## 2015-09-10 DIAGNOSIS — E1165 Type 2 diabetes mellitus with hyperglycemia: Secondary | ICD-10-CM | POA: Diagnosis not present

## 2015-09-10 LAB — FRUCTOSAMINE: FRUCTOSAMINE: 301 umol/L — AB (ref 0–285)

## 2015-09-10 MED ORDER — ROSUVASTATIN CALCIUM 20 MG PO TABS: 20.0000 mg | ORAL_TABLET | Freq: Every day | ORAL | Status: DC

## 2015-09-10 NOTE — Patient Instructions (Signed)
Use temp basal 50% at work

## 2015-09-10 NOTE — Progress Notes (Signed)
Patient ID: Alex Tucker, male   DOB: 21-Sep-1970, 45 y.o.   MRN: [id number]    Reason for Appointment: F/u for Type 2 Diabetes  Referring physician: Merilynn Finland  History of Present Illness:          Diagnosis: Type 2 diabetes mellitus, date of diagnosis:  2014      Past history:  He apparently had no symptoms at diagnosis and his blood sugar a year before at his physical was normal Details of his initial diagnosis are not available but he thinks his blood sugars were about 180 at that time with A1c around 9% He was initially tried on metformin but he could not tolerate this because of diarrhea Subsequently on his own he was trying to lose weight with diet and apparently lost 40 pounds around the end of 2014 His PCP started him on insulin in 12/14 approximately when his A1c was high and he was also losing weight. His initial consultation he was told to try Invokana and Victoza along with his basal insulin but he could not get Victoza because of non-coverage by his insurance.  Also he was having side effects of not feeling well and having some nausea with Invokana and stopped this in 8/15 after Also his Levemir was changed to Lantus insulin once a day; initially was started on 20 units   Recent history:   He  started an insulin pump, [employer]tr[employer] brand on 08/29/14 because of poor control with basal bolus insulin regimens;  prior to going on the pump A1c was over 10%  PUMP settings:Basal rate at midnight = 1.05 , 5 AM = 1.2, 7 AM = 1.45 11 AM = 1.0 and 6 PM = 1.5  Carbohydrate coverage 1:7 until 12 noon, 1:5 until 6 PM and then 1:6. 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 at this time; this is on about 2-3 days a week and on a irregular schedule  Blood sugar readings over the last 2 weeks show the following patterns and problems identified are as follows:  Fasting glucose is not being checked consistently but it appears to have mostly low or low normal readings  Blood sugars are fairly consistently high after 4 PM until about 9-10 PM with a couple of exceptions  Blood sugars at bedtime are generally better but not consistent also  Average blood sugar at suppertime is over 200  He was told to use a temporary basal the nights he is doing heavy physical work to avoid overnight hypoglycemia but he did not do so  He thinks he can avoid hypoglycemia while being very active at work by eating a sandwich halfway through  Overall still has  variability with standard deviation 81, previously  96  HYPOGLYCEMIA As above mostly fasting and occasionally midday and once at bedtime  Mean values apply above for all meters except median for One Touch  PRE-MEAL Fasting Lunch Dinner Bedtime Overall  Glucose range:  45-204   67-242   95-278 52-299    Mean/median:     185   158+/-81    Oral hypoglycemic drugs the patient is taking are: None Side effects from medications  have been: Metformin causes diarrhea, Invokana causes nausea and malaise   Glycemic control:  Lab Results  Component Value Date   HGBA1C 8.3 06/26/2015   HGBA1C 8.3* 03/02/2015   HGBA1C 8.8* 10/31/2014   Lab Results  Component Value Date   MICROALBUR <0.7 10/31/2014   LDLCALC 129* 09/09/2015   CREATININE 1.13 09/09/2015    Self-care: The diet that the patient has been following [id number] low fat  Meals: 3 meals per day. 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 Sodium 09/09/2015 137  135 - 145 mEq/L Final  . Potassium 09/09/2015 4.3  3.5 - 5.1 mEq/L Final  . Chloride 09/09/2015 101  96 - 112 mEq/L Final  . CO2 09/09/2015 30  19 - 32 mEq/L Final  . Glucose, Bld 09/09/2015 167* 70 - 99 mg/dL Final  . BUN 40/98/1191 17  6 - 23 mg/dL Final  . Creatinine, Ser 09/09/2015 1.13  0.40 - 1.50 mg/dL Final  . Calcium 47/82/9562 9.1  8.4 - 10.5 mg/dL Final  . GFR 13/03/6577 75.00  >60.00 mL/min Final  . Cholesterol 09/09/2015 190  0 - 200 mg/dL Final   ATP III Classification       Desirable:  < 200 mg/dL               Borderline High:  200 - 239 mg/dL          High:  > = 469 mg/dL  . Triglycerides 09/09/2015 146.0  0.0 - 149.0 mg/dL Final   Normal:  <629 mg/dLBorderline High:  150 - 199 mg/dL  . HDL 09/09/2015 32.60* >39.00 mg/dL Final  . VLDL 52/84/1324 29.2  0.0 - 40.0 mg/dL Final  . LDL Cholesterol 09/09/2015 129* 0 - 99 mg/dL Final  . Total CHOL/HDL Ratio 09/09/2015 6   Final                  Men          Women1/2 Average Risk     3.4          3.3Average Risk          5.0          4.42X Average Risk          9.6          7.13X Average Risk          15.0          11.0                      . NonHDL 09/09/2015 157.77   Final   NOTE:  Non-HDL goal should be 30 mg/dL higher than patient's LDL goal (i.e. LDL goal of < 70 mg/dL, would have non-HDL goal of < 100 mg/dL)  . Fructosamine 09/09/2015 301* 0 - 285 umol/L Final  Comment: Published reference interval for apparently healthy subjects between age 78 and 38 is 48 - 285 umol/L and in a poorly controlled diabetic population is 228 - 563 umol/L with a mean of 396 umol/L.       Medication List       This list is accurate as of: 09/10/15  9:47 AM.   Always use your most recent med list.               atenolol 50 MG tablet  Commonly known as:  TENORMIN  Take 100 mg by mouth daily.     atorvastatin 40 MG tablet  Commonly known as:  LIPITOR  Take 1 tablet (40 mg total) by mouth daily.     citalopram 40 MG tablet  Commonly known as:  CELEXA  Take 40 mg by mouth daily.     glucagon 1 MG Solr injection  Commonly known as:  GLUCAGEN HYPOKIT  Inject 1 mg into the vein once as needed for low blood sugar.     insulin lispro 100 UNIT/ML injection  Commonly known as:  HUMALOG  Use  Max 80 units daily in insulin pump. Dx code E11.65     levETIRAcetam 1000 MG tablet  Commonly known as:  KEPPRA  Take 2,000 mg by mouth 2 (two) times daily.     omeprazole 40 MG capsule  Commonly known as:  PRILOSEC        Allergies:  Allergies  Allergen Reactions  . Codeine Itching    Past Medical History  Diagnosis Date  . High cholesterol   . TBI (traumatic brain injury) (HCC)   . Seizures (HCC)   . Diabetes mellitus without complication (HCC)   . GERD (gastroesophageal reflux disease)     Past Surgical History  Procedure Laterality Date  . Hand surgery      Family History  Problem Relation Age of Onset  . Diabetes Mother   . Diabetes Paternal Grandmother   . Diabetes Paternal Grandfather     Social History:  reports that he has been smoking.  He has never used smokeless tobacco. He reports that he does not drink alcohol or use illicit drugs.    Review of Systems       Lipids: on 40 mg Lipitor for treatment of mixed hyperlipidemia and is treated by PCP Previously was on pravastatin and cholesterol improving LDL still high Even though he thinks he is compliant with his Lipitor.       Lab Results  Component Value Date   CHOL 190 09/09/2015   HDL 32.60* 09/09/2015   LDLCALC 129* 09/09/2015   LDLDIRECT 148.0 03/26/2015   TRIG 146.0 09/09/2015   CHOLHDL 6 09/09/2015    History of depression treated with Celexa    he takes seizure prophylaxis medicine   Physical Examination:  BP 110/60 mmHg  Pulse 70  Temp(Src) 98 F (36.7 C)  Resp 14  Ht  (1.626 m)  Wt 167 lb 3.2 oz (75.841 kg)  BMI 28.69 kg/m2  SpO2 97%    not indicated    ASSESSMENT:  Diabetes, insulin-dependent   See history of present illness for review of his current blood sugar patterns, problems identified  and pump  management His blood sugars have been overall relatively better compared to his last visit However still has inconsistent control HYPERGLYCEMIA.  Be occurring in the afternoons and sporadically late evening also Hypoglycemia: This occurs mostly early morning and sometimes midday Currently not having hypoglycemia when  he is doing his heavy physical work at [employer] Also generally active  Recommendations as below  Day-to-day management discussed with patient in detail covering all these issues  Hyperlipidemia: Now on Lipitor and lipids are not controlled, LDL still over 100   PLAN:   Basal rate changes as follows: 5 AM = 1.1, 7 AM = 1.35, 3 PM = 1.2, 6 PM = 1.6 and 10 PM = 1.4   Carbohydrate ratio unchanged  Use a temporary basal of abou 50% while doing physical work in the evenings  More blood sugars throughout the day  Try to look into the new insulin pumps from [employer], given brochures and discussed differences  Extra insulin for higher fat meals and extended bolus  Call if blood sugar is not well controlled.   change Lipitor to Crestor 20 mg  Counseling time on subjects discussed above is over 50% of today's 25 minute visit   Damany Eastman 09/10/2015, 9:47 AM   Note: This office note was prepared with Insurance underwriter. Any transcriptional errors that result from this process are unintentional.

## 2015-10-20 ENCOUNTER — Telehealth: Payer: Self-pay | Admitting: Endocrinology

## 2015-10-20 NOTE — Telephone Encounter (Signed)
Pt advised of note below and voiced understanding.  

## 2015-10-20 NOTE — Telephone Encounter (Signed)
Please call: New pump settings are as follows: Basal rate at midnight = 0.95, 5 AM = 1.1, 7 AM = 1.3  10 AM = 0.9, 1 PM = 1.3  and 6 PM = 1.5

## 2015-10-20 NOTE — Telephone Encounter (Signed)
He said his low sugars are always in the morning, he gave me today's readings and when I asked for the last three days he said they are all between 80-100, could not give specifics.

## 2015-10-20 NOTE — Telephone Encounter (Signed)
PT requests call from [facility], he feels that his insulin needs to be adjusted because he has been hitting a lot of low levels lately.

## 2015-10-20 NOTE — Telephone Encounter (Signed)
Can be get his blood sugar readings for the last 3 days and when he is having low sugars?

## 2015-10-20 NOTE — Telephone Encounter (Signed)
Patient called, he said this morning when he woke up his blood sugar was 90, he went out and had breakfast came home and within just a few minutes of being home his sugar dropped to 28,  He also said that his morning sugars are always 80-100.  He wants to know if the sensitivity should be changed? Please advise

## 2015-10-20 NOTE — Telephone Encounter (Signed)
Readings:  02/24- 3 am-50 02/25- 170 02/26 9:43-76 02/28 9:55am - 94 02/28 12:17 pm- 28  He said he is more active since the weather has been nicer and wonders if he needs a spring/summer setting?

## 2015-11-09 ENCOUNTER — Other Ambulatory Visit: Payer: Self-pay | Admitting: *Deleted

## 2015-11-09 ENCOUNTER — Telehealth: Payer: Self-pay | Admitting: Endocrinology

## 2015-11-09 MED ORDER — OMEPRAZOLE 40 MG PO CPDR: 40.0000 mg | DELAYED_RELEASE_CAPSULE | Freq: Every day | ORAL | Status: DC

## 2015-11-09 NOTE — Telephone Encounter (Signed)
rx sent to [facility]

## 2015-11-09 NOTE — Telephone Encounter (Signed)
Omeprazole rx needs to be resubmitted please call into [facility] in [place]

## 2015-12-03 ENCOUNTER — Other Ambulatory Visit (INDEPENDENT_AMBULATORY_CARE_PROVIDER_SITE_OTHER): Payer: Medicare Other

## 2015-12-03 DIAGNOSIS — E7800 Pure hypercholesterolemia, unspecified: Secondary | ICD-10-CM

## 2015-12-03 DIAGNOSIS — E1165 Type 2 diabetes mellitus with hyperglycemia: Secondary | ICD-10-CM | POA: Diagnosis not present

## 2015-12-03 DIAGNOSIS — Z794 Long term (current) use of insulin: Secondary | ICD-10-CM | POA: Diagnosis not present

## 2015-12-03 LAB — LIPID PANEL
Cholesterol: 185 mg/dL (ref 0–200)
HDL: 33.8 mg/dL — ABNORMAL LOW (ref >39.00)
LDL Cholesterol: 118 mg/dL — ABNORMAL HIGH (ref 0–99)
NONHDL: 151
TRIGLYCERIDES: 163 mg/dL — AB (ref 0.0–149.0)
Total CHOL/HDL Ratio: 5
VLDL: 32.6 mg/dL (ref 0.0–40.0)

## 2015-12-03 LAB — MICROALBUMIN / CREATININE URINE RATIO
Creatinine,U: 177.4 mg/dL
MICROALB/CREAT RATIO: 0.5 mg/g (ref 0.0–30.0)
Microalb, Ur: 0.9 mg/dL (ref 0.0–1.9)

## 2015-12-03 LAB — HEMOGLOBIN A1C: Hgb A1c MFr Bld: 8.9 % — ABNORMAL HIGH (ref 4.6–6.5)

## 2015-12-03 LAB — COMPREHENSIVE METABOLIC PANEL
ALBUMIN: 4 g/dL (ref 3.5–5.2)
ALT: 20 U/L (ref 0–53)
AST: 16 U/L (ref 0–37)
Alkaline Phosphatase: 67 U/L (ref 39–117)
BUN: 16 mg/dL (ref 6–23)
CALCIUM: 9.5 mg/dL (ref 8.4–10.5)
CO2: 32 meq/L (ref 19–32)
CREATININE: 1.16 mg/dL (ref 0.40–1.50)
Chloride: 101 mEq/L (ref 96–112)
GFR: 72.69 mL/min (ref >60.00)
Glucose, Bld: 152 mg/dL — ABNORMAL HIGH (ref 70–99)
Potassium: 4.5 mEq/L (ref 3.5–5.1)
Sodium: 137 mEq/L (ref 135–145)
Total Bilirubin: 0.4 mg/dL (ref 0.2–1.2)
Total Protein: 7.4 g/dL (ref 6.0–8.3)

## 2015-12-09 ENCOUNTER — Encounter: Payer: Self-pay | Admitting: Endocrinology

## 2015-12-09 ENCOUNTER — Ambulatory Visit (INDEPENDENT_AMBULATORY_CARE_PROVIDER_SITE_OTHER): Payer: Medicare Other | Admitting: Endocrinology

## 2015-12-09 VITALS — BP 126/74 | HR 72 | Temp 97.6°F | Resp 14 | Ht 64.0 in | Wt 165.2 lb

## 2015-12-09 DIAGNOSIS — E1065 Type 1 diabetes mellitus with hyperglycemia: Secondary | ICD-10-CM | POA: Diagnosis not present

## 2015-12-09 MED ORDER — ROSUVASTATIN CALCIUM 20 MG PO TABS: 20.0000 mg | ORAL_TABLET | Freq: Every day | ORAL | Status: DC

## 2015-12-09 NOTE — Patient Instructions (Signed)
Check some sugars after meals

## 2015-12-09 NOTE — Progress Notes (Signed)
Patient ID: Alex Tucker, male   DOB: 04/16/1971, 45 y.o.   MRN: [id number]    Reason for Appointment: F/u for Type 2 Diabetes  Referring physician: Merilynn Finland  History of Present Illness:          Diagnosis: Type 2 diabetes mellitus, date of diagnosis:  2014      Past history:  He apparently had no symptoms at diagnosis and his blood sugar a year before at his physical was normal Details of his initial diagnosis are not available but he thinks his blood sugars were about 180 at that time with A1c around 9% He was initially tried on metformin but he could not tolerate this because of diarrhea Subsequently on his own he was trying to lose weight with diet and apparently lost 40 pounds around the end of 2014 His PCP started him on insulin in 12/14 approximately when his A1c was high and he was also losing weight. His initial consultation he was told to try Invokana and Victoza along with his basal insulin but he could not get Victoza because of non-coverage by his insurance.  Also he was having side effects of not feeling well and having some nausea with Invokana and stopped this in 8/15 after Also his Levemir was changed to Lantus insulin once a day; initially was started on 20 units   Recent history:   He  started an insulin pump, [employer]tr[employer] brand on 08/29/14 because of poor control with basal bolus insulin regimens;  prior to going on the pump A1c was over 10%  PUMP settings:Basal rate at midnight = 0.95 , 5 AM = 1.1, 7 AM = 1.3 10 AM = 0.9, 1 PM = 1.25 and 6 PM = 1.5  Carbohydrate coverage 1:7 until 12 noon, 1:5 until 6 PM and then 1:6. 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  Has a low sugar of about 60+ twice, once after doing a correction during the night  Blood sugars appear to be the highest between about 12 noon-4 PM with or without a meal.  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  Blood sugars are usually excellent in the evenings between 6-10 PM  He says that using a temporary basal of 50% while doing his heavy physical work in the evening and having a snack keeps him from getting low sugars although he does not check a sugar while working  Blood sugars late at night after work or inconsistent with a couple of high readings probably related to his meal late at night  Occasionally with eating dessert his blood sugar will go up significantly, once over 400 if he does not estimate the coverage correctly  He does not always check readings 2 hours after meals to check the POSTPRANDIAL readings but most of his readings appear to be  adequate  Overall still has  variability with standard deviation 95 HYPOGLYCEMIA is occurring sporadically, lowest reading late morning possibly related to stacking boluses for higher reading and a meal and a couple of times after correcting readings that were over 400  Mean values apply above for all meters except median for One Touch  PRE-MEAL Fasting Lunch Dinner Bedtime Overall  Glucose range: 62-303     77-259    Mean/median:   171  129 161+/-95     Oral hypoglycemic drugs the patient is taking are: None Side effects from medications have been: Metformin causes diarrhea, Invokana causes nausea and malaise   Glycemic control:  Lab Results  Component Value Date   HGBA1C 8.9* 12/03/2015   HGBA1C  8.3 06/26/2015   HGBA1C 8.3* 03/02/2015   Lab Results  Component Value Date   MICROALBUR 0.9 12/03/2015   LDLCALC 118* 12/03/2015   CREATININE 1.16 12/03/2015    Self-care: The diet that the patient has been following [id number] low fat  Meals: 3 meals per day. 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 Hgb A1c MFr Bld 12/03/2015 8.9* 4.6 - 6.5 % Final   Glycemic Control Guidelines for People with Diabetes:Non Diabetic:  <6%Goal of Therapy: <7%Additional Action Suggested:  >8%   . Sodium 12/03/2015 137  135 - 145 mEq/L Final  . Potassium 12/03/2015 4.5  3.5 - 5.1 mEq/L Final  . Chloride 12/03/2015 101  96 - 112 mEq/L Final  . CO2 12/03/2015 32  19 - 32 mEq/L Final  . Glucose, Bld 12/03/2015 152* 70 - 99 mg/dL Final  . BUN 08/65/7846 16  6 - 23 mg/dL Final  . Creatinine, Ser 12/03/2015 1.16  0.40 - 1.50 mg/dL Final  . Total Bilirubin 12/03/2015 0.4  0.2 - 1.2 mg/dL Final  . Alkaline Phosphatase 12/03/2015 67  39 - 117 U/L Final  . AST 12/03/2015 16  0 - 37 U/L Final  . ALT 12/03/2015 20  0 - 53 U/L Final  . Total Protein 12/03/2015 7.4  6.0 - 8.3 g/dL Final  . Albumin 96/29/5284 4.0  3.5 - 5.2 g/dL Final  . Calcium 13/24/4010 9.5  8.4 - 10.5 mg/dL Final  . GFR 27/25/3664 72.69  >60.00 mL/min Final  . Cholesterol 12/03/2015 185  0 - 200 mg/dL Final   ATP III Classification       Desirable:  < 200 mg/dL               Borderline High:  200 - 239 mg/dL          High:  > = 403 mg/dL  . Triglycerides 12/03/2015 163.0* 0.0 - 149.0 mg/dL  Final   Normal:  <474 mg/dLBorderline High:  150 - 199 mg/dL  . HDL 12/03/2015 33.80* >39.00 mg/dL Final  . VLDL 25/95/6387 32.6  0.0 - 40.0 mg/dL  Final  . LDL Cholesterol 12/03/2015 118* 0 - 99 mg/dL Final  . Total CHOL/HDL Ratio 12/03/2015 5   Final                  Men          Women1/2 Average Risk     3.4          3.3Average Risk          5.0          4.42X Average Risk          9.6          7.13X Average Risk          15.0          11.0                      . NonHDL 12/03/2015 151.00   Final   NOTE:  Non-HDL goal should be 30 mg/dL higher than patient's LDL goal (i.e. LDL goal of < 70 mg/dL, would have non-HDL goal of < 100 mg/dL)  . Microalb, Ur 12/03/2015 0.9  0.0 - 1.9 mg/dL Final  . Creatinine,U 41/32/4401 177.4   Final  . Microalb Creat Ratio 12/03/2015 0.5  0.0 - 30.0 mg/g Final      Medication List       This list is accurate as of: 12/09/15  9:22 PM.  Always use your most recent med list.               atenolol 50 MG tablet  Commonly known as:  TENORMIN  Take 100 mg by mouth daily.     citalopram 40 MG tablet  Commonly known as:  CELEXA  Take 40 mg by mouth daily.     glucagon 1 MG Solr injection  Commonly known as:  GLUCAGEN HYPOKIT  Inject 1 mg into the vein once as needed for low blood sugar.     insulin lispro 100 UNIT/ML injection  Commonly known as:  HUMALOG  Use  Max 80 units daily in insulin pump. Dx code E11.65     levETIRAcetam 1000 MG tablet  Commonly known as:  KEPPRA  Take 2,000 mg by mouth 2 (two) times daily.     omeprazole 40 MG capsule  Commonly known as:  PRILOSEC  Take 1 capsule (40 mg total) by mouth daily.     rosuvastatin 20 MG tablet  Commonly known as:  CRESTOR  Take 1 tablet (20 mg total) by mouth daily.        Allergies:  Allergies  Allergen Reactions  . Codeine Itching    Past Medical History  Diagnosis Date  . High cholesterol   . TBI (traumatic brain injury) (HCC)   . Seizures (HCC)   . Diabetes mellitus  without complication (HCC)   . GERD (gastroesophageal reflux disease)     Past Surgical History  Procedure Laterality Date  . Hand surgery      Family History  Problem Relation Age of Onset  . Diabetes Mother   . Diabetes Paternal Grandmother   . Diabetes Paternal Grandfather     Social History:  reports that he has been smoking.  He has never used smokeless tobacco. He reports that he does not drink alcohol or use illicit drugs.    Review of Systems       Lipids: on 40 mg Lipitor for treatment of mixed hyperlipidemia and is treated by PCP LDL still  high, this was high with Lipitor and more recently has forgotten to pick of his prescription Crestor.was given      Lab Results  Component Value Date   CHOL 185 12/03/2015   HDL 33.80* 12/03/2015   LDLCALC 118* 12/03/2015   LDLDIRECT 148.0 03/26/2015   TRIG 163.0* 12/03/2015   CHOLHDL 5 12/03/2015    History of depression treated with Celexa   he takes seizure prophylaxis medicine   Physical Examination:  BP 126/74 mmHg  Pulse 72  Temp(Src) 97.6 F (36.4 C)  Resp 14  Ht 5\' 4"  (1.626 m)  Wt 165 lb 3.2 oz (74.934 kg)  BMI 28.34 kg/m2  SpO2 96%    not indicated     ASSESSMENT/PLAN:   Diabetes, insulin-dependent   See history of present illness for review of his current blood sugar patterns, problems identified  and pump  management His blood sugars have been overall quite labile and difficult to get a consistent pattern He says that blood sugars maybe higher at times including recently from his influenza and sinus infections He is normally very active in spring and summer and is not quite at his usual activity level; this usually brings his blood sugars down which are now mostly higher mid-day in early afternoon  Since there is no consistent glucose patterns could not make any changes in his management today  Day-to-day management discussed with patient in detail covering all these issues He was given the  option of using the conditions glucose monitoring or the new [employer] pump.  He will contact [employer] to facilitate this, discussed the advantages of closed loop system  Hyperlipidemia: He needs to start taking Crestor that was prescribed and he will do so     Counseling time on subjects discussed above is over 50% of today's 25 minute visit   Rishaan Gunner 12/09/2015, 9:22 PM   Note: This office note was prepared with Insurance underwriterDragon voice recognition system technology. Any transcriptional errors that result from this process are unintentional.

## 2016-01-13 ENCOUNTER — Other Ambulatory Visit: Payer: Self-pay | Admitting: Endocrinology

## 2016-01-14 ENCOUNTER — Other Ambulatory Visit: Payer: Self-pay | Admitting: *Deleted

## 2016-01-14 MED ORDER — INSULIN LISPRO 100 UNIT/ML ~~LOC~~ SOLN: SUBCUTANEOUS | Status: DC

## 2016-01-15 ENCOUNTER — Telehealth: Payer: Self-pay | Admitting: Endocrinology

## 2016-01-15 NOTE — Telephone Encounter (Signed)
That was taken care of yesterday 

## 2016-01-15 NOTE — Telephone Encounter (Signed)
Patient stated [facility] in [place] fax over a request for insulin medication humalog, they need a Dx code in order to fill it.   [facility] - [place], [place] - [address] [phone] (Phone) [phone] (Fax)

## 2016-03-04 ENCOUNTER — Other Ambulatory Visit (INDEPENDENT_AMBULATORY_CARE_PROVIDER_SITE_OTHER): Payer: Medicare Other

## 2016-03-04 ENCOUNTER — Telehealth: Payer: Self-pay | Admitting: Endocrinology

## 2016-03-04 DIAGNOSIS — E1065 Type 1 diabetes mellitus with hyperglycemia: Secondary | ICD-10-CM

## 2016-03-04 LAB — COMPREHENSIVE METABOLIC PANEL
ALBUMIN: 4 g/dL (ref 3.5–5.2)
ALK PHOS: 63 U/L (ref 39–117)
ALT: 16 U/L (ref 0–53)
AST: 17 U/L (ref 0–37)
BUN: 16 mg/dL (ref 6–23)
CO2: 29 mEq/L (ref 19–32)
CREATININE: 1.26 mg/dL (ref 0.40–1.50)
Calcium: 9.3 mg/dL (ref 8.4–10.5)
Chloride: 100 mEq/L (ref 96–112)
GFR: 66 mL/min (ref >60.00)
GLUCOSE: 293 mg/dL — AB (ref 70–99)
Potassium: 4.5 mEq/L (ref 3.5–5.1)
SODIUM: 135 meq/L (ref 135–145)
TOTAL PROTEIN: 7 g/dL (ref 6.0–8.3)
Total Bilirubin: 0.2 mg/dL (ref 0.2–1.2)

## 2016-03-04 LAB — LIPID PANEL
CHOLESTEROL: 188 mg/dL (ref 0–200)
HDL: 34.8 mg/dL — ABNORMAL LOW (ref >39.00)
LDL CALC: 120 mg/dL — AB (ref 0–99)
NONHDL: 153.01
Total CHOL/HDL Ratio: 5
Triglycerides: 164 mg/dL — ABNORMAL HIGH (ref 0.0–149.0)
VLDL: 32.8 mg/dL (ref 0.0–40.0)

## 2016-03-04 LAB — HEMOGLOBIN A1C: HEMOGLOBIN A1C: 8.3 % — AB (ref 4.6–6.5)

## 2016-03-04 NOTE — Telephone Encounter (Signed)
Patient ask you to call him about his pump. [phone]

## 2016-03-07 NOTE — Telephone Encounter (Signed)
Patient reports that he has been having difficulty with [employer] and [employer] to get the CGM or a possible pump upgrade.  He did not know how old his pump is, and said he has tried [employer] and [employer], and does not know what he can do to get a CGM.  I spoke with Roxanne Ripple this morning and gave her his information.  She said she would look into this problem and call him with the information he needs to proceed with either the CGM or the 670G pump.

## 2016-03-09 ENCOUNTER — Ambulatory Visit: Payer: Medicare Other | Admitting: Endocrinology

## 2016-04-06 ENCOUNTER — Ambulatory Visit: Payer: Medicare Other | Admitting: Endocrinology

## 2016-05-04 ENCOUNTER — Ambulatory Visit (INDEPENDENT_AMBULATORY_CARE_PROVIDER_SITE_OTHER): Payer: Medicare Other | Admitting: Endocrinology

## 2016-05-04 ENCOUNTER — Encounter: Payer: Self-pay | Admitting: Endocrinology

## 2016-05-04 VITALS — BP 125/74 | HR 61 | Ht 64.0 in | Wt 167.0 lb

## 2016-05-04 DIAGNOSIS — E1165 Type 2 diabetes mellitus with hyperglycemia: Secondary | ICD-10-CM | POA: Diagnosis not present

## 2016-05-04 DIAGNOSIS — E7800 Pure hypercholesterolemia, unspecified: Secondary | ICD-10-CM | POA: Diagnosis not present

## 2016-05-04 DIAGNOSIS — Z794 Long term (current) use of insulin: Secondary | ICD-10-CM | POA: Diagnosis not present

## 2016-05-04 NOTE — Progress Notes (Signed)
Patient ID: Alex GrebeBrian M Tucker, male   DOB: 07/14/1971, 45 y.o.   MRN: [id number]    Reason for Appointment: F/u for Type 2 Diabetes  Referring physician: Merilynn Finlandobertson  History of Present Illness:          Diagnosis: Type 2 diabetes mellitus, date of diagnosis:  2014      Past history:  He apparently had no symptoms at diagnosis and his blood sugar a year before at his physical was normal Details of his initial diagnosis are not available but he thinks his blood sugars were about 180 at that time with A1c around 9% He was initially tried on metformin but he could not tolerate this because of diarrhea Subsequently on his own he was trying to lose weight with diet and apparently lost 40 pounds around the end of 2014 His PCP started him on insulin in 12/14 approximately when his A1c was high and he was also losing weight. His initial consultation he was told to try Invokana and Victoza along with his basal insulin but he could not get Victoza because of non-coverage by his insurance.  Also he was having side effects of not feeling well and having some nausea with Invokana and stopped this in 8/15 after Also his Levemir was changed to Lantus insulin once a day; initially was started on 20 units   Recent history:   He  started an insulin pump, Medtronic brand on 08/29/14 because of poor control with basal bolus insulin regimens;  prior to going on the pump A1c was over 10%  PUMP settings:Basal rate at midnight = 0.95 , 5 AM = 1.1, 7 AM = 1.3 10 AM = 0.9, 1 PM = 1.25 and 6 PM = 1.5  Carbohydrate coverage 1:7 until 12 noon, 1:5 until 6 PM and then 1:6. 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    Sometimes will be having snacks like fruits man may not be bolusing adequately for these  Blood sugars around suppertime are also quite variable  POSTPRANDIAL blood sugars are quite variable in the evening and he thinks that he can not always predict why his sugars are increasing.  However occasionally will have higher fat meals and may not cover for this.  He is usually bolusing before meals  He thinks he is changing his infusion set every 2-3 days  He tends to have occasional overnight hypoglycemia, but 3 times in the last 2 weeks at around 3-4 AM  Overall still has significant variability with standard deviation 94 HYPOGLYCEMIA is occurring sporadically, lowest reading late morning possibly related to stacking boluses for higher reading and a meal and a couple of times after correcting readings that were over 400  Mean values apply above for  all meters except median for One Touch  PRE-MEAL Fasting Lunch Dinner Bedtime Overall  Glucose range: 62-315 60-235  72-340  98-400+    Mean/median:     180   OVERNIGHT: 40-400+  Oral hypoglycemic drugs the patient is taking are: None Side effects from medications have been: Metformin causes diarrhea, Invokana causes nausea and malaise   Glycemic control:  Lab Results  Component Value Date   HGBA1C 8.3 (H) 03/04/2016   HGBA1C 8.9 (H) 12/03/2015   HGBA1C 8.3 06/26/2015   Lab Results  Component Value Date   MICROALBUR 0.9 12/03/2015   LDLCALC 120 (H) 03/04/2016   CREATININE 1.26 03/04/2016    Self-care: The diet that the  patient has been following [id number] low fat  Meals: 3 meals per day. 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  Latest known visit with results is:  Lab on 03/04/2016  Component Date Value Ref Range Status  . Hgb A1c MFr Bld 03/04/2016 8.3* 4.6 - 6.5 % Final  . Sodium 03/04/2016 135  135 - 145 mEq/L Final  . Potassium 03/04/2016 4.5  3.5 - 5.1 mEq/L Final  . Chloride 03/04/2016 100  96 - 112 mEq/L Final  . CO2 03/04/2016 29  19 - 32 mEq/L Final  . Glucose, Bld 03/04/2016 293* 70 - 99 mg/dL Final  . BUN 40/98/1191 16  6 - 23 mg/dL Final  . Creatinine, Ser 03/04/2016 1.26  0.40 - 1.50 mg/dL Final  . Total Bilirubin 03/04/2016 0.2  0.2 - 1.2 mg/dL Final  . Alkaline Phosphatase 03/04/2016 63  39 - 117 U/L Final  . AST 03/04/2016 17  0 - 37 U/L Final  . ALT 03/04/2016 16  0 - 53 U/L Final  . Total Protein 03/04/2016 7.0  6.0 - 8.3 g/dL Final  . Albumin 47/82/9562 4.0  3.5 - 5.2 g/dL Final  . Calcium 13/03/6577 9.3  8.4 - 10.5 mg/dL Final  . GFR 46/96/2952 66.00  >60.00 mL/min Final  . Cholesterol 03/04/2016 188  0 - 200 mg/dL Final  . Triglycerides 03/04/2016 164.0* 0.0 - 149.0 mg/dL Final  . HDL 84/13/2440 34.80* >39.00 mg/dL Final  . VLDL 06/18/2535 32.8  0.0 - 40.0 mg/dL Final  . LDL Cholesterol 03/04/2016 120* 0 - 99 mg/dL Final  . Total CHOL/HDL Ratio 03/04/2016 5   Final  . NonHDL 03/04/2016 153.01   Final      Medication List       Accurate as of 05/04/16 10:56 AM. Always use your most recent med list.          atenolol 50 MG  tablet Commonly known as:  TENORMIN Take 100 mg by mouth daily.   citalopram 40 MG tablet Commonly known as:  CELEXA Take 40 mg by mouth daily.   glucagon 1 MG Solr injection Commonly known as:  GLUCAGEN HYPOKIT  Inject 1 mg into the vein once as needed for low blood sugar.   insulin lispro 100 UNIT/ML injection Commonly known as:  HUMALOG USE A MAXIMIUM OF 80 UNITS IN INSULIN PUMP. Dx code E11.65   levETIRAcetam 1000 MG tablet Commonly known as:  KEPPRA Take 2,000 mg by mouth 2 (two) times daily.   omeprazole 40 MG capsule Commonly known as:  PRILOSEC Take 1 capsule (40 mg total) by mouth daily.   rosuvastatin 20 MG tablet Commonly known as:  CRESTOR Take 1 tablet (20 mg total) by mouth daily.       Allergies:  Allergies  Allergen Reactions  . Codeine Itching    Past Medical History:  Diagnosis Date  . Diabetes mellitus without complication (HCC)   . GERD (gastroesophageal reflux disease)   . High cholesterol   . Seizures (HCC)   . TBI (traumatic brain injury) [facility])     Past Surgical History:  Procedure Laterality Date  . HAND SURGERY      Family History  Problem Relation Age of Onset  . Diabetes Mother   . Diabetes Paternal Grandmother   . Diabetes Paternal Grandfather     Social History:  reports that he has been smoking.  He has been smoking about 0.50 packs per day. He has never used smokeless tobacco. He reports that he does not drink alcohol or use drugs.    Review of Systems       Lipids: on 40 mg Lipitor for treatment of mixed hyperlipidemia and is treated by PCP LDL still high.  He is now seeing a new PCP who told him he wants a baseline labs without any medications      Lab Results  Component Value Date   CHOL 188 03/04/2016   HDL 34.80 (L) 03/04/2016   LDLCALC 120 (H) 03/04/2016   LDLDIRECT 148.0 03/26/2015   TRIG 164.0 (H) 03/04/2016   CHOLHDL 5 03/04/2016    History of depression treated with Celexa   he takes seizure  prophylaxis medicine   Physical Examination:  BP 125/74   Pulse 61   Ht 5\' 4"  (1.626 m)   Wt 167 lb (75.8 kg)   BMI 28.67 kg/m       ASSESSMENT/PLAN:   Diabetes, insulin-dependent   See history of present illness for review of his current blood sugar patterns, problems identified  and pump  management His blood sugars have been overall quite labile and difficult to get a consistent pattern again A1c consistently over 8%  Most of his blood sugar trends appear to be related to inadequate basal rate in the morning hours, possibly in the early afternoon and tendency to occasional hypoglycemia overnight Postprandial readings may be higher when he has higher fat meals are eating fruits like grapes without any adequate coverage HYPOGLYCEMIA is mostly sporadic overnight and he may occasionally feel hypoglycemic when active at work and may not remember to reduce his basal rate  Day-to-day management discussed  Recommendations:  Basal rate at midnight = 0.85-7 AM = 1.4, 10 AM = 1.0  Extra 40-50% boluses for higher fat meals  More postprandial monitoring  He will again look into the continuous glucose monitoring  He will try to be consistent with using temporary basal and he is more active  Discussed appropriate treatment of hypoglycemia with simple sugars and not food which he is doing now Hyperlipidemia: He is now being managed by PCP, not on treatment as yet   Counseling time on subjects discussed  above is over 50% of today's 25 minute visit   Leam Madero 05/04/2016, 10:56 AM   Note: This office note was prepared with Insurance underwriter. Any transcriptional errors that result from this process are unintentional.

## 2016-05-04 NOTE — Patient Instructions (Addendum)
Extra 50% more bolus for hi fat meals  More testing

## 2016-07-23 ENCOUNTER — Other Ambulatory Visit: Payer: Self-pay | Admitting: Endocrinology

## 2016-07-29 ENCOUNTER — Telehealth: Payer: Self-pay | Admitting: Endocrinology

## 2016-07-29 ENCOUNTER — Other Ambulatory Visit: Payer: Self-pay | Admitting: Endocrinology

## 2016-07-29 NOTE — Telephone Encounter (Signed)
omeprazole (PRILOSEC) 40 MG capsule pharmacy is having some confusion with medication   omeprazole (PRILOSEC) 40 MG capsule  They will faxing over a request.  [facility] [id number] - [place], [place] - [phone] (Phone) [phone] (Fax)

## 2016-08-01 ENCOUNTER — Other Ambulatory Visit: Payer: Self-pay

## 2016-08-01 MED ORDER — OMEPRAZOLE 40 MG PO CPDR: DELAYED_RELEASE_CAPSULE | ORAL | 3 refills | Status: DC

## 2016-08-01 NOTE — Telephone Encounter (Signed)
Problem solved spoke with pharmacy

## 2016-08-10 ENCOUNTER — Ambulatory Visit (INDEPENDENT_AMBULATORY_CARE_PROVIDER_SITE_OTHER): Payer: Medicare Other | Admitting: Endocrinology

## 2016-08-10 ENCOUNTER — Encounter: Payer: Self-pay | Admitting: Endocrinology

## 2016-08-10 VITALS — BP 112/72 | HR 72 | Ht 64.0 in | Wt 169.0 lb

## 2016-08-10 DIAGNOSIS — E1165 Type 2 diabetes mellitus with hyperglycemia: Secondary | ICD-10-CM

## 2016-08-10 LAB — POCT GLYCOSYLATED HEMOGLOBIN (HGB A1C): HEMOGLOBIN A1C: 8.2

## 2016-08-10 NOTE — Progress Notes (Signed)
Patient ID: Alex Tucker, male   DOB: Oct 06, 1970, 45 y.o.   MRN: [id number]    Reason for Appointment: F/u for Type 2 Diabetes  Referring physician: Merilynn Finland  History of Present Illness:          Diagnosis: Type 2 diabetes mellitus, date of diagnosis:  2014      Past history:  He apparently had no symptoms at diagnosis and his blood sugar a year before at his physical was normal Details of his initial diagnosis are not available but he thinks his blood sugars were about 180 at that time with A1c around 9% He was initially tried on metformin but he could not tolerate this because of diarrhea Subsequently on his own he was trying to lose weight with diet and apparently lost 40 pounds around the end of 2014 His PCP started him on insulin in 12/14 approximately when his A1c was high and he was also losing weight. His initial consultation he was told to try Invokana and Victoza along with his basal insulin but he could not get Victoza because of non-coverage by his insurance.  Also he was having side effects of not feeling well and having some nausea with Invokana and stopped this in 8/15 after Also his Levemir was changed to Lantus insulin once a day; initially was started on 20 units   Recent history:   He  started an insulin pump, Medtronic brand on 08/29/14 because of poor control with basal bolus insulin regimens;  prior to going on the pump A1c was over 10%  PUMP settings:Basal rate at midnight = 0.95 , 5 AM = 1.1, 7 AM = 1.3 10 AM = 0.9, 1 PM = 1.25 and 6 PM = 1.5  Carbohydrate coverage 1:7 until 12 noon, 1:5 until 6 PM and then 1:6. 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; recently working 5 days a  week He did not make the changes in her basal rates as recommended on the last visit and not clear why  Blood sugar readings over the last 2 weeks show the following patterns and problems identified are as follows:  Fasting glucose is not consistently abnormal, mildly increased at times but is checking readings anywhere between 6 AM-12 noon  Blood sugars are sometimes rising after his noon meal but not consistently and has not done enough postprandial readings  He thinks some of his high readings in the last week or so where when he was traveling out of town and not eating well  He does not always eat breakfast   Blood sugars around suppertime are also quite variable but frequently near normal  POSTPRANDIAL blood sugars after evening meal are mostly significantly high  Apparently he has been trying to use a dual bolus frequently in the evening but he is taking off his pump right after supper for a shower and not getting some of the insulin for this  No hypoglycemia at work and he is only needing to have a snack and no change in his basal rate temporarily  He is usually bolusing before meals  He thinks he is changing his infusion set every 2-3 days  He tends to have  occasional overnight hypoglycemia, only once recently and once at midnight  Overall still has significant variability with standard deviation 104 HYPOGLYCEMIA is occurring sporadically, once possibly from late night bolus for his meal and another time at midnight possibly postprandial  Mean values apply above for all meters except median for One Touch  PRE-MEAL Fasting Lunch Dinner Bedtime Overall  Glucose range: 62-315 60-235  72-340  98-400+    Mean/median:     180   OVERNIGHT: 40-400+  Oral hypoglycemic drugs the patient is taking are: None Side effects from medications have been: Metformin causes diarrhea, Invokana causes nausea and malaise   Glycemic control:  Lab Results  Component Value Date   HGBA1C 8.2  08/10/2016   HGBA1C 8.3 (H) 03/04/2016   HGBA1C 8.9 (H) 12/03/2015   Lab Results  Component Value Date   MICROALBUR 0.9 12/03/2015   LDLCALC 120 (H) 03/04/2016   CREATININE 1.26 03/04/2016    Self-care: The diet that the patient has been following [id number]:usually low fat  Meals: 3 meals per day. 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 Hemoglobin A1C 08/10/2016 8.2   Final    Allergies as of 08/10/2016      Reactions   Codeine Itching      Medication List       Accurate as of 08/10/16 11:59 PM. Always use your most recent med list.          atenolol 50 MG tablet Commonly known as:  TENORMIN Take 100 mg by mouth daily.   citalopram 40 MG tablet Commonly known as:  CELEXA Take 40 mg by mouth daily.   glucagon 1 MG Solr injection Commonly known as:  GLUCAGEN HYPOKIT Inject 1 mg into the vein once as needed for low blood sugar.   insulin lispro 100 UNIT/ML injection Commonly known as:  HUMALOG USE A MAXIMIUM OF 80 UNITS IN INSULIN PUMP. Dx code E11.65   levETIRAcetam 1000 MG tablet Commonly known as:  KEPPRA Take 2,000 mg by mouth 2 (two) times daily.   omeprazole 40 MG capsule Commonly known as:  PRILOSEC TAKE 1 CAPSULE(40 MG) BY MOUTH DAILY   rosuvastatin 20 MG tablet Commonly known as:  CRESTOR Take 1 tablet (20 mg total) by mouth daily.       Allergies:  Allergies  Allergen Reactions  . Codeine Itching    Past Medical History:  Diagnosis Date  . Diabetes mellitus without complication (HCC)   .  GERD (gastroesophageal reflux disease)   . High cholesterol   . Seizures (HCC)   . TBI (traumatic brain injury) [facility](HCC)     Past Surgical History:  Procedure Laterality Date  . HAND SURGERY      Family History  Problem Relation Age of Onset  . Diabetes Mother   . Diabetes Paternal Grandmother   . Diabetes Paternal Grandfather  Social History:  reports that he has been smoking.  He has been smoking about 0.50 packs per day. He has never used smokeless tobacco. He reports that he does not drink alcohol or use drugs.    Review of Systems       Lipids: on 40 mg Lipitor for treatment of mixed hyperlipidemia and is treated by PCP LDL still high.    He is now on Crestor And followed by PCP      Lab Results  Component Value Date   CHOL 188 03/04/2016   HDL 34.80 (L) 03/04/2016   LDLCALC 120 (H) 03/04/2016   LDLDIRECT 148.0 03/26/2015   TRIG 164.0 (H) 03/04/2016   CHOLHDL 5 03/04/2016    History of depression treated with Celexa   He takes seizure prophylaxis medicine   Physical Examination:  BP 112/72   Pulse 72   Ht 5\' 4"  (1.626 m)   Wt 169 lb (76.7 kg)   SpO2 96%   BMI 29.01 kg/m       ASSESSMENT/PLAN:   Diabetes, insulin-dependent   See history of present illness for review of his current blood sugar patterns, problems identified  and pump  management A1c consistently over 8%  His blood sugars still inconsistently controlled especially nonfasting  Most of his high blood sugar readings are postprandial after his breakfast or evening meal Some of his evening hyperglycemia is related to his not finishing his extended bolus as he is going for a shower after his meal. Blood sugars in the late afternoons are variable and may be influenced at least on some days by his work schedule Only rarely has had infusion site problems causing high sugars Postprandial readings may be higher when he has higher fat meals and not adding extra insulin although he is trying to  do better with this and also extended boluses HYPOGLYCEMIA is mostly sporadic overnight possibly related to boluses late in the evening and some delayed effect of exercise with work  Day-to-day management discussed  Recommendations:  [facility] will be 1.7  Carbohydrate ratio at 6 PM = 1:5  Make sure he gets all his bolus in the evening for his meal even though he is planning to take a shower right after eating  He will look into the 670 pump  If he has hypoglycemia during work hours he will try to use temporary basal  Call if having overnight hypoglycemia  Hyperlipidemia: He is now being managed by PCP on Crestor   Counseling time on subjects discussed above is over 50% of today's 25 minute visit   Mikaeel Petrow 08/11/2016, 2:14 PM   Note: This office note was prepared with Dragon voice recognition system technology. Any transcriptional errors that result from this process are unintentional.

## 2016-09-01 ENCOUNTER — Other Ambulatory Visit: Payer: Self-pay | Admitting: Endocrinology

## 2016-10-06 ENCOUNTER — Other Ambulatory Visit: Payer: Self-pay | Admitting: Endocrinology

## 2016-10-14 ENCOUNTER — Telehealth: Payer: Self-pay | Admitting: Endocrinology

## 2016-10-14 ENCOUNTER — Other Ambulatory Visit: Payer: Self-pay | Admitting: Endocrinology

## 2016-10-14 MED ORDER — INSULIN LISPRO 100 UNIT/ML ~~LOC~~ SOLN: SUBCUTANEOUS | 5 refills | Status: DC

## 2016-10-14 NOTE — Telephone Encounter (Signed)
Pt has not been able to get his blood sugar below 500 all day. Its only been today.-pt is on hold currently

## 2016-10-14 NOTE — Telephone Encounter (Signed)
Patient had had respiratory illness on the weekend but was fine last evening Today woke up with a blood sugar over 500 and despite changing infusion set and boluses blood sugars are not below 500 and he has mild nausea.  Did not have any issue with the infusion set that he replaced Advised him to take 15 units Humalog with a syringe, increase temporary basal by 200% for at least 4 hours and start using insulin from a new bottle.  Prescription called

## 2016-11-04 ENCOUNTER — Other Ambulatory Visit: Payer: Self-pay | Admitting: Endocrinology

## 2016-11-08 ENCOUNTER — Ambulatory Visit (INDEPENDENT_AMBULATORY_CARE_PROVIDER_SITE_OTHER): Payer: Medicare Other | Admitting: Endocrinology

## 2016-11-08 ENCOUNTER — Encounter: Payer: Self-pay | Admitting: Endocrinology

## 2016-11-08 VITALS — BP 102/60 | HR 60 | Ht 64.0 in | Wt 170.0 lb

## 2016-11-08 DIAGNOSIS — E1065 Type 1 diabetes mellitus with hyperglycemia: Secondary | ICD-10-CM

## 2016-11-08 DIAGNOSIS — E1165 Type 2 diabetes mellitus with hyperglycemia: Secondary | ICD-10-CM | POA: Diagnosis not present

## 2016-11-08 DIAGNOSIS — E7800 Pure hypercholesterolemia, unspecified: Secondary | ICD-10-CM

## 2016-11-08 LAB — LIPID PANEL
Cholesterol: 180 mg/dL (ref 0–200)
HDL: 40.2 mg/dL (ref >39.00)
LDL Cholesterol: 121 mg/dL — ABNORMAL HIGH (ref 0–99)
NonHDL: 139.86
TRIGLYCERIDES: 96 mg/dL (ref 0.0–149.0)
Total CHOL/HDL Ratio: 4
VLDL: 19.2 mg/dL (ref 0.0–40.0)

## 2016-11-08 LAB — COMPREHENSIVE METABOLIC PANEL
ALT: 19 U/L (ref 0–53)
AST: 15 U/L (ref 0–37)
Albumin: 4.1 g/dL (ref 3.5–5.2)
Alkaline Phosphatase: 68 U/L (ref 39–117)
BUN: 17 mg/dL (ref 6–23)
CHLORIDE: 104 meq/L (ref 96–112)
CO2: 30 mEq/L (ref 19–32)
Calcium: 9.7 mg/dL (ref 8.4–10.5)
Creatinine, Ser: 1.07 mg/dL (ref 0.40–1.50)
GFR: 79.45 mL/min (ref >60.00)
Glucose, Bld: 144 mg/dL — ABNORMAL HIGH (ref 70–99)
POTASSIUM: 4.5 meq/L (ref 3.5–5.1)
SODIUM: 136 meq/L (ref 135–145)
Total Bilirubin: 0.4 mg/dL (ref 0.2–1.2)
Total Protein: 7.2 g/dL (ref 6.0–8.3)

## 2016-11-08 LAB — POCT GLYCOSYLATED HEMOGLOBIN (HGB A1C): HEMOGLOBIN A1C: 7.5

## 2016-11-08 LAB — MICROALBUMIN / CREATININE URINE RATIO
CREATININE, U: 118.4 mg/dL
Microalb Creat Ratio: 0.6 mg/g (ref 0.0–30.0)

## 2016-11-08 LAB — HEMOGLOBIN A1C: Hgb A1c MFr Bld: 8.1 % — ABNORMAL HIGH (ref 4.6–6.5)

## 2016-11-08 MED ORDER — FREESTYLE LIBRE SENSOR SYSTEM MISC: 1.0000 | 3 refills | Status: DC

## 2016-11-08 MED ORDER — FREESTYLE LIBRE READER DEVI: 1.0000 | 0 refills | Status: DC

## 2016-11-08 NOTE — Progress Notes (Signed)
Patient ID: Alex Tucker, male   DOB: [birth year], 46 y.o.   MRN: [id number]    Reason for Appointment: F/u for Type 2 Diabetes  Referring physician: Merilynn Finland  History of Present Illness:          Diagnosis: Type 2 diabetes mellitus, date of diagnosis:  2014      Past history:  He apparently had no symptoms at diagnosis and his blood sugar a year before at his physical was normal Details of his initial diagnosis are not available but he thinks his blood sugars were about 180 at that time with A1c around 9% He was initially tried on metformin but he could not tolerate this because of diarrhea Subsequently on his own he was trying to lose weight with diet and apparently lost 40 pounds around the end of 2014 His PCP started him on insulin in 12/14 approximately when his A1c was high and he was also losing weight. His initial consultation he was told to try Invokana and Victoza along with his basal insulin but he could not get Victoza because of non-coverage by his insurance.  Also he was having side effects of not feeling well and having some nausea with Invokana and stopped this in 8/15 after Also his Levemir was changed to Lantus insulin once a day; initially was started on 20 units   Recent history:   He  Has been on a Medtronic  Pump since 08/29/14 because of poor control with basal bolus insulin regimens;  prior to going on the pump A1c was over 10%  PUMP settings:Basal rate at midnight = 0.95 , 5 AM = 1.1, 7 AM = 1.3 10 AM = 0.9, 1 PM = 1.25 and 6 PM = 1.5 , 8:30 PM = 1.7  Carbohydrate coverage 1:7 until 12 noon, 1:5  Lunch and dinner Sensitivity 1:30 at midnight ,1:20  until 6 Pm and then 1:25; target 120 Active insulin 4 hours  A1c is now 7.5%, has been as high as 8.9 before  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  His basal rate was increased at 8:30 PM on the last visit since his readings are usually higher at bedtime; he still has occasional high readings after 7-8 PM but not consistently  HYPOGLYCEMIA has occurred sporadically at around midnight-3 AM with blood sugars in the 40s but this is mostly on the days he has worked.  He will occasionally use temporary basal at bedtime if blood sugars are dropping  However he is forgetting to take the basal rate down temporarily when he is active at work and may start feeling hypoglycemic  Treatment of hypoglycemia: He was trying to eat peanut butter crackers along with drinking sweet drink but this would cause rebound hyperglycemia the next day  Overall lowest blood sugars are in the early afternoon  He is usually bolusing before  meals  He says that he is trying to eat lower fat meals especially since his wife has been placed on a low-fat diet, this he thinks is helping his sugars  Overall still has less variability with standard deviation 63 compared to 104 previously; also has not had as many extremely high readings except rarely  Last month he had called about his blood sugar been persistently over 500 and he eventually got this done by taking extra insulin with a syringe and using a 200% temporary basal  Mean values apply above for all meters except median for One Touch  PRE-MEAL Fasting Lunch Dinner Bedtime Overall  Glucose range: 64-215   61-173  47-259    Mean/median: 149  167  129  167       Oral hypoglycemic drugs the patient is taking are: None Side effects from medications have been: Metformin causes diarrhea, Invokana causes nausea and malaise   Glycemic control:   Lab Results  Component Value Date   HGBA1C 8.2 08/10/2016   HGBA1C 8.3 (H) 03/04/2016   HGBA1C 8.9 (H) 12/03/2015   Lab Results  Component Value Date   MICROALBUR 0.9 12/03/2015   LDLCALC 120 (H) 03/04/2016   CREATININE 1.26 03/04/2016    Self-care: The diet that the patient has been following ZO:XWRUEAVis:usually low fat  Meals: 3 meals per day. 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  Latest known visit with results is:  Office Visit on 08/10/2016  Component Date Value Ref Range Status  . Hemoglobin A1C 08/10/2016 8.2   Final    Allergies as of 11/08/2016      Reactions   Codeine Itching      Medication List       Accurate as of 11/08/16 10:08 AM. Always use your most recent med list.          atenolol 50 MG tablet Commonly known as:  TENORMIN Take 100 mg by mouth daily.   citalopram 40 MG tablet Commonly known as:  CELEXA Take 40 mg by mouth daily.   glucagon 1 MG Solr injection Commonly known as:  GLUCAGEN HYPOKIT Inject 1 mg into the vein once as needed for low blood sugar.   insulin lispro 100 UNIT/ML injection Commonly known as:  HUMALOG USE A MAXIMIUM OF 80 UNITS IN INSULIN PUMP. Dx code E11.65   levETIRAcetam 1000 MG tablet Commonly known as:  KEPPRA Take 2,000 mg by mouth 2 (two) times daily.   omeprazole 40 MG capsule Commonly known as:   PRILOSEC TAKE 1 CAPSULE(40 MG) BY MOUTH DAILY   omeprazole 40 MG capsule Commonly known as:  PRILOSEC TAKE 1 CAPSULE(40 MG) BY MOUTH DAILY   rosuvastatin 20 MG tablet Commonly known as:  CRESTOR Take 1 tablet (20 mg total) by mouth daily.       Allergies:  Allergies  Allergen Reactions  . Codeine  Itching    Past Medical History:  Diagnosis Date  . Diabetes mellitus without complication (HCC)   . GERD (gastroesophageal reflux disease)   . High cholesterol   . Seizures (HCC)   . TBI (traumatic brain injury) [facility])     Past Surgical History:  Procedure Laterality Date  . HAND SURGERY      Family History  Problem Relation Age of Onset  . Diabetes Mother   . Diabetes Paternal Grandmother   . Diabetes Paternal Grandfather     Social History:  reports that he has been smoking.  He has been smoking about 0.50 packs per day. He has never used smokeless tobacco. He reports that he does not drink alcohol or use drugs.    Review of Systems       Lipids:  LDL still high as of 7/17.    He is now on Crestor, Has not had any recent labs      Lab Results  Component Value Date   CHOL 188 03/04/2016   HDL 34.80 (L) 03/04/2016   LDLCALC 120 (H) 03/04/2016   LDLDIRECT 148.0 03/26/2015   TRIG 164.0 (H) 03/04/2016   CHOLHDL 5 03/04/2016      He takes seizure prophylaxis medicine   Physical Examination:  BP 102/60   Pulse 60   Ht 5\' 4"  (1.626 m)   Wt 170 lb (77.1 kg)   SpO2 95%   BMI 29.18 kg/m       ASSESSMENT/PLAN:   Diabetes, insulin-dependent   See history of present illness for review of his current blood sugar patterns, problems identified  and pump  management A1c Is finally below 8% now  His blood sugars did not fluctuate as much This is partly related to his trying to be better with his diet and is back on higher fat foods such as fried foods and peanut butter  HYPOGLYCEMIA is happening relatively more because of overall lower blood sugars and  lower fat intake in the evening He does not use temporary basal with his increased activity and also his low sugars tend to continue at night after his heavy physical work is  finished  Day-to-day management discussed  Recommendations:  Basal rate to continue unchanged for now  He will try to make sure he has a temporary basal rate in affect when he Back from his work for 4 hours and also tried to use this if he remembers at work  He will try to again contact his supplier to get the Medtronic sensors  Meanwhile he can try the freestyle Libre sensor which was discussed in detail today Discussed appropriate treatment of hypoglycemia and avoiding food when treating this  Hyperlipidemia: He is now being managed by PCP on Crestor, lipids with be reassessed today   Counseling time on subjects discussed above is over 50% of today's 25 minute visit   Gavriel Holzhauer 11/08/2016, 10:08 AM   Note: This office note was prepared with Insurance underwriter. Any transcriptional errors that result from this process are unintentional.

## 2016-11-08 NOTE — Patient Instructions (Signed)
Temp basal of 50-60% when active at work and then for 4 hrs from bedtime on

## 2016-12-25 ENCOUNTER — Other Ambulatory Visit: Payer: Self-pay | Admitting: Endocrinology

## 2017-02-08 ENCOUNTER — Ambulatory Visit (INDEPENDENT_AMBULATORY_CARE_PROVIDER_SITE_OTHER): Payer: Medicare Other | Admitting: Endocrinology

## 2017-02-08 ENCOUNTER — Encounter: Payer: Self-pay | Admitting: Endocrinology

## 2017-02-08 VITALS — BP 110/74 | HR 74 | Ht 64.0 in | Wt 166.6 lb

## 2017-02-08 DIAGNOSIS — E1065 Type 1 diabetes mellitus with hyperglycemia: Secondary | ICD-10-CM

## 2017-02-08 DIAGNOSIS — E7800 Pure hypercholesterolemia, unspecified: Secondary | ICD-10-CM

## 2017-02-08 LAB — GLUCOSE, POCT (MANUAL RESULT ENTRY): POC Glucose: 210 mg/dl — AB (ref 70–99)

## 2017-02-08 LAB — POCT GLYCOSYLATED HEMOGLOBIN (HGB A1C): Hemoglobin A1C: 8.3

## 2017-02-08 NOTE — Progress Notes (Signed)
Patient ID: Alex Tucker, male   DOB: 1970/09/02, 46 y.o.   MRN: [id number]    Reason for Appointment: F/u for Type 2 Diabetes  Referring physician: Merilynn Finland  History of Present Illness:          Diagnosis: Type 2 diabetes mellitus, date of diagnosis:  2014      Past history:  He apparently had no symptoms at diagnosis and his blood sugar a year before at his physical was normal Details of his initial diagnosis are not available but he thinks his blood sugars were about 180 at that time with A1c around 9% He was initially tried on metformin but he could not tolerate this because of diarrhea Subsequently on his own he was trying to lose weight with diet and apparently lost 40 pounds around the end of 2014 His PCP started him on insulin in 12/14 approximately when his A1c was high and he was also losing weight. His initial consultation he was told to try Invokana and Victoza along with his basal insulin but he could not get Victoza because of non-coverage by his insurance.  Also he was having side effects of not feeling well and having some nausea with Invokana and stopped this in 8/15 after Also his Levemir was changed to Lantus insulin once a day; initially was started on 20 units   Recent history:   He  Has been on a Medtronic  Pump since 08/29/14 because of poor control with basal bolus insulin regimens;  prior to going on the pump A1c was over 10%  PUMP settings:Basal rate at midnight = 0.95 , 5 AM = 1.1, 7 AM = 1.5 10 AM = 0.9, 1 PM = 1.25 and 6 PM = 1.5 , 8:30 PM = 1.7  Carbohydrate coverage 1:7 until 12 noon, 1:5  Lunch and dinner Sensitivity 1:30 at midnight ,1:20  until 6 Pm and then 1:25; target 120 Active insulin 4 hours  A1c is now 8.3, previously 7.5%, has been as high as 8.9 before  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; working on and off in no regular schedule Blood sugar readings from his  pump over the last 2 weeks show the following patterns and problems identified are as follows:   Fasting glucose is almost always high although occasionally better especially after doing a correction during the night, not checking daily  He says that he is needing increased coverage for his meals as blood sugar may go up after eating and he will add another 10-20 g of carbohydrates for his food intake  Compared to the last time he is appearing to need more insulin  However still has quite a bit of variability in his blood sugars at all times with standard deviation 92  He says that he will try to use a temporary basal while working when he is very active but does not see this on his last 2 weeks download  To prevent hypoglycemia even generally keep drinking Gatorade while he is working  With this he has only rare hypoglycemia and the only low blood sugar was early Friday morning   HIGHEST blood sugars appear to be during the night when he is checking them with only one exception  Overall lowest blood sugars are probably before supper time and variable later in the evening  He is usually bolusing before meals  He says that he is trying  to eat lower fat meals but does have regular intake of higher fat foods still and using extended boluses frequently  Mean values apply above for all meters except median for One Touch  PRE-MEAL Fasting Lunch Dinner Overnight  Overall  Glucose range:  72-265  62-288   67-3 01   54-3 67    Mean/median: 216   184   204+/-92    POST-MEAL PC Breakfast PC Lunch PC Dinner  Glucose range:    1 61-400+   Mean/median:   254     Oral hypoglycemic drugs the patient is taking are: None Side effects from medications have been: Metformin causes diarrhea, Invokana causes nausea and malaise   Glycemic control:   Lab Results  Component Value Date   HGBA1C 8.3 02/08/2017   HGBA1C 7.5 11/08/2016   HGBA1C 8.1 (H) 11/08/2016   Lab Results  Component Value  Date   MICROALBUR <0.7 11/08/2016   LDLCALC 121 (H) 11/08/2016   CREATININE 1.07 11/08/2016    Self-care: The diet that the patient has been following ZO:XWRUEAVis:usually low fat  Meals: 3 meals per day. 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 POC Glucose 02/08/2017 210* 70 - 99 mg/dl Final  . Hemoglobin [id number] 02/08/2017 8.3   Final    Allergies as of 02/08/2017      Reactions   Codeine Itching      Medication List       Accurate as of 02/08/17  9:06 PM. Always use your most recent med list.          atenolol 50 MG tablet Commonly known as:  TENORMIN Take 100 mg by mouth daily.   citalopram 40 MG tablet Commonly known as:  CELEXA Take 40 mg by mouth daily.   FREESTYLE LIBRE READER Devi 1 Device by Does not apply route as directed.   FREESTYLE LIBRE SENSOR SYSTEM Misc 1 strip by Does not apply route once a week. Apply to upper arm and change sensor every 10 days   glucagon 1 MG Solr injection Commonly known as:  GLUCAGEN HYPOKIT Inject 1 mg into the vein once as needed for low blood sugar.   insulin lispro 100 UNIT/ML injection Commonly known as:  HUMALOG USE A MAXIMIUM OF 80 UNITS IN INSULIN PUMP. Dx code E11.65   levETIRAcetam 1000 MG tablet Commonly known as:  KEPPRA Take 2,000 mg by mouth 2 (two) times daily.   omeprazole 40 MG capsule Commonly known as:  PRILOSEC TAKE 1 CAPSULE(40 MG) BY MOUTH DAILY   rosuvastatin 20 MG tablet Commonly known as:  CRESTOR Take 1 tablet (20 mg total) by mouth daily.        Allergies:  Allergies  Allergen Reactions  . Codeine Itching    Past Medical History:  Diagnosis Date  . Diabetes mellitus without complication (HCC)   .  GERD (gastroesophageal reflux disease)   . High cholesterol   . Seizures (HCC)   . TBI (traumatic brain injury) [facility])     Past Surgical History:  Procedure Laterality Date  . HAND SURGERY      Family History  Problem Relation Age of Onset  . Diabetes Mother   . Diabetes Paternal Grandmother   . Diabetes Paternal Grandfather     Social History:  reports that he has been smoking.  He has been smoking about 0.50 packs per day. He has never used smokeless tobacco. He reports that he does not drink alcohol or use drugs.    Review of Systems       Lipids:  LDL still high as of 3/18, these were forwarded to his PCP but he was not called about this   He is now on Crestor      Lab Results  Component Value Date   CHOL 180 11/08/2016   HDL 40.20 11/08/2016   LDLCALC 121 (H) 11/08/2016   LDLDIRECT 148.0 03/26/2015   TRIG 96.0 11/08/2016   CHOLHDL 4 11/08/2016      He takes seizure prophylaxis medicine   Physical Examination:  BP 110/74   Pulse 74   Ht 5\' 4"  (1.626 m)   Wt 166 lb 9.6 oz (75.6 kg)   SpO2 97%   BMI 28.60 kg/m       ASSESSMENT/PLAN:   Diabetes, insulin-dependent   See history of present illness for review of his current blood sugar patterns, problems identified  and pump  management A1c Is Back up to 8.3  His blood sugars have been fluctuating more previously were better controlled because of changing his diet He thinks he is not changing his diet although with his using extended boluses he probably is getting higher fat intake more regularly He still has difficulty balancing his activity and blood sugars while at work and not using a lower temporary basal as recommended did not fluctuate as much This is partly related to his trying to be better with his diet and is back on higher fat  foods such as fried foods and peanut butter  HYPOGLYCEMIA is less frequent Does not appear to have any overcorrection of high readings recently POSTPRANDIAL readings are difficult to assess as he does not check 2 hours after but he thinks he needs more boluses to keep blood sugars from going up  Day-to-day management discussed in detail again  Recommendations:  Basal rate to be increased  He will use 2 different basal rates based on whether he is working or not: His settings are scanned in  Overnight basal rates will be ranging from 1.05 up to 1.4 and on his off days slightly higher  Also his evening basal rates at 6 PM on his off days will be 1.6 and 1.8  Carbohydrate coverage 1:4 between 8 AM-6 PM  He will need to use temporary basal more often when he is active at work  He will get a C-peptide checked through his PCP since his fasting glucose today is 210  This will allow him to get coverage for his FreeStyle Libre sensor and other continuous glucose monitoring the future   Hyperlipidemia: He is now being managed by PCP on Crestor, lipids have been forwarded and he will need to discuss with PCP again May need Zetia   Counseling time on subjects discussed above is over 50% of today's 25 minute visit   Haddie Bruhl 02/08/2017, 9:06 PM   Note: This  office note was prepared with Insurance underwriterDragon voice recognition system technology. Any transcriptional errors that result from this process are unintentional.

## 2017-05-11 ENCOUNTER — Ambulatory Visit: Payer: Medicare Other | Admitting: Endocrinology

## 2017-05-26 ENCOUNTER — Other Ambulatory Visit (HOSPITAL_COMMUNITY): Payer: Self-pay | Admitting: Family Medicine

## 2017-05-26 ENCOUNTER — Ambulatory Visit (HOSPITAL_COMMUNITY)
Admission: RE | Admit: 2017-05-26 | Discharge: 2017-05-26 | Disposition: A | Discharge status: Home / Self Care | Payer: Medicare Other | Source: Ambulatory Visit | Attending: Family Medicine | Admitting: Family Medicine

## 2017-05-26 DIAGNOSIS — M25521 Pain in right elbow: Secondary | ICD-10-CM | POA: Diagnosis present

## 2017-05-26 DIAGNOSIS — M25511 Pain in right shoulder: Secondary | ICD-10-CM | POA: Diagnosis not present

## 2017-06-27 ENCOUNTER — Encounter: Payer: Self-pay | Admitting: Endocrinology

## 2017-06-27 ENCOUNTER — Ambulatory Visit (INDEPENDENT_AMBULATORY_CARE_PROVIDER_SITE_OTHER): Payer: Medicare Other | Admitting: Endocrinology

## 2017-06-27 VITALS — BP 112/68 | HR 72 | Ht 64.0 in | Wt 173.0 lb

## 2017-06-27 DIAGNOSIS — E782 Mixed hyperlipidemia: Secondary | ICD-10-CM

## 2017-06-27 DIAGNOSIS — E1065 Type 1 diabetes mellitus with hyperglycemia: Secondary | ICD-10-CM | POA: Diagnosis not present

## 2017-06-27 DIAGNOSIS — Z794 Long term (current) use of insulin: Secondary | ICD-10-CM

## 2017-06-27 LAB — LIPID PANEL
CHOL/HDL RATIO: 4
Cholesterol: 152 mg/dL (ref 0–200)
HDL: 40.4 mg/dL (ref >39.00)
LDL Cholesterol: 91 mg/dL (ref 0–99)
NONHDL: 111.42
Triglycerides: 104 mg/dL (ref 0.0–149.0)
VLDL: 20.8 mg/dL (ref 0.0–40.0)

## 2017-06-27 LAB — BASIC METABOLIC PANEL
BUN: 13 mg/dL (ref 6–23)
CALCIUM: 10.1 mg/dL (ref 8.4–10.5)
CO2: 30 mEq/L (ref 19–32)
CREATININE: 1.07 mg/dL (ref 0.40–1.50)
Chloride: 102 mEq/L (ref 96–112)
GFR: 79.22 mL/min (ref >60.00)
Glucose, Bld: 99 mg/dL (ref 70–99)
Potassium: 4 mEq/L (ref 3.5–5.1)
SODIUM: 137 meq/L (ref 135–145)

## 2017-06-27 LAB — POCT GLYCOSYLATED HEMOGLOBIN (HGB A1C): Hemoglobin A1C: 7.9

## 2017-06-27 LAB — GLUCOSE, POCT (MANUAL RESULT ENTRY): POC GLUCOSE: 124 mg/dL — AB (ref 70–99)

## 2017-06-27 NOTE — Patient Instructions (Signed)
Temp basal 25% for active days

## 2017-06-27 NOTE — Progress Notes (Signed)
Patient ID: Alex Tucker, male   DOB: [birth year], 46 y.o.   MRN: [id number]    Reason for Appointment: F/u for Type 2 Diabetes   History of Present Illness:          Diagnosis: Type 2 diabetes mellitus, date of diagnosis:  2014      Past history:  He apparently had no symptoms at diagnosis and his blood sugar a year before at his physical was normal Details of his initial diagnosis are not available but he thinks his blood sugars were about 180 at that time with A1c around 9% He was initially tried on metformin but he could not tolerate this because of diarrhea Subsequently on his own he was trying to lose weight with diet and apparently lost 40 pounds around the end of 2014 His PCP started him on insulin in 12/14 approximately when his A1c was high and he was also losing weight. His initial consultation he was told to try Invokana and Victoza along with his basal insulin but he could not get Victoza because of non-coverage by his insurance.  Also he was having side effects of not feeling well and having some nausea with Invokana and stopped this in 8/15 after Also his Levemir was changed to Lantus insulin once a day; initially was started on 20 units   Recent history:   He  Has been on a Medtronic  Pump since 08/29/14 because of poor control with basal bolus insulin regimens;  prior to going on the pump A1c was over 10%  PUMP settings:Basal rate at midnight = 0.95 , 5 AM = 1.1, 7 AM = 1.5 10 AM = 0.9, 1 PM = 1.25 and 6 PM = 1.5 , 8:30 PM = 1.7  Carbohydrate coverage 1:7 until 12 noon, 1:5  Lunch and dinner Sensitivity 1:30 at midnight ,1:20  until 6 Pm and then 1:25; target 120 Active insulin 4 hours  A1c is now 8.3, previously 7.5%, has been as high as 8.9 before  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; working on and off in no regular schedule Blood sugar readings from his pump over the last 2 weeks show  the following patterns and problems identified are as follows:   Fasting glucose is again almost always high  He was supposed to use 2 different basal rate after his last visit but he has not done so and does not appear to have changes overnight basal rates as directed also   Again his blood sugars are fluctuating significantly most of the time  Overall blood sugars are much higher between morning and early afternoon  They are generally lower lower than usual after about 6 PM with aortic hypoglycemia almost every day  He says that his activity level is variable at work and he cannot use a different basal rate for his work schedule  He is frequently trying to use 50% basal when he is working more physically still may occasionally get low sugars  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 he does not use a temporary basal and last Saturday had a significant problem with low sugars midday and afternoon  Although his postprandial readings are variable they may be tending to get low after lunch with increasing his carbohydrate coverage 1:4 on the last visit  He is usually bolusing before meals; however when eating right before going to work he will  not take the bolus because of tendency to low sugars  He says that he is trying to eat lower fat meals but does have regular intake of higher fat foods still and using extended boluses   Mean values apply above for all meters except median for One Touch  PRE-MEAL Fasting Lunch Dinner Bedtime Overall  Glucose range: 100-342   52-400 +   49-281   66-347    Mean/median: 198   155   174+/-196    POST-MEAL PC Breakfast PC Lunch PC Dinner  Glucose range:     Mean/median:  169       Side effects from medications have been: Metformin causes diarrhea, Invokana causes nausea and malaise   Glycemic control:   Lab Results  Component Value Date   HGBA1C 8.3 02/08/2017   HGBA1C 7.5 11/08/2016   HGBA1C 8.1 (H) 11/08/2016   Lab Results    Component Value Date   MICROALBUR <0.7 11/08/2016   LDLCALC 121 (H) 11/08/2016   CREATININE 1.07 11/08/2016    Self-care: The diet that the patient has been following BJ:YNWGNFAis:usually low fat  Meals: 3 meals per day. 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  Latest known visit with results is:  Office Visit on 02/08/2017  Component Date Value Ref Range Status  . POC Glucose 02/08/2017 210* 70 - 99 mg/dl Final  . Hemoglobin O1HA1C 02/08/2017 8.3   Final    Allergies as of 06/27/2017      Reactions   Codeine Itching      Medication List        Accurate as of 06/27/17 10:26 AM. Always use your most recent med list.          atenolol 50 MG tablet Commonly known as:  TENORMIN Take 100 mg by mouth daily.   citalopram 40 MG tablet Commonly known as:  CELEXA Take 40 mg by mouth daily.   FREESTYLE LIBRE READER Devi 1 Device by Does not apply route as directed.   FREESTYLE LIBRE SENSOR SYSTEM Misc 1 strip by Does not apply route once a week. Apply to upper arm and change sensor every 10 days   glucagon 1 MG Solr injection Commonly known as:  GLUCAGEN HYPOKIT Inject 1 mg into the vein once as needed for low blood sugar.   insulin lispro 100 UNIT/ML injection Commonly known as:  HUMALOG USE A MAXIMIUM OF 80 UNITS IN INSULIN PUMP. Dx code E11.65   levETIRAcetam 1000 MG tablet Commonly known as:  KEPPRA Take 2,000 mg by mouth 2 (two) times daily.   omeprazole 40 MG capsule Commonly known as:  PRILOSEC TAKE 1 CAPSULE(40 MG) BY MOUTH DAILY    rosuvastatin 20 MG tablet Commonly known as:  CRESTOR Take 1 tablet (20 mg total) by mouth daily.       Allergies:  Allergies  Allergen Reactions  .  Codeine Itching    Past Medical History:  Diagnosis Date  . Diabetes mellitus without complication (HCC)   . GERD (gastroesophageal reflux disease)   . High cholesterol   . Seizures (HCC)   . TBI (traumatic brain injury) [facility])     Past Surgical History:  Procedure Laterality Date  . HAND SURGERY      Family History  Problem Relation Age of Onset  . Diabetes Mother   . Diabetes Paternal Grandmother   . Diabetes Paternal Grandfather     Social History:  reports that he has been smoking.  He has been smoking about 0.50 packs per day. he has never used smokeless tobacco. He reports that he does not drink alcohol or use drugs.    Review of Systems       Lipids:  LDL still high as of 3/18, these were forwarded to his PCP   He is now on Crestor 20 mg, this is not being changed by his PCP, his labs were forwarded on the last visit and he apparently has not had any follow-up labs also      Lab Results  Component Value Date   CHOL 180 11/08/2016   HDL 40.20 11/08/2016   LDLCALC 121 (H) 11/08/2016   LDLDIRECT 148.0 03/26/2015   TRIG 96.0 11/08/2016   CHOLHDL 4 11/08/2016      He takes Keppra for seizure prophylaxis    Physical Examination:  BP 112/68   Pulse 72   Ht 5\' 4"  (1.626 m)   Wt 173 lb (78.5 kg)   SpO2 96%   BMI 29.70 kg/m       ASSESSMENT/PLAN:   Diabetes, insulin-dependent   See history of present illness for review of his current blood sugar patterns, problems identified  and pump  Management  A1c Is still relatively high at 7.9  His blood sugars have been fluctuating again Despite trying different basal rate for work and off days he did not think this helped and his blood sugars are about the same as on the last visit He did not make any changes to his basal rate also as directed  especially overnight basal rate His fasting readings are mostly high as discussed above Blood sugars are primarily high until early afternoon and tending to be low either postprandial or with activity in the evenings fairly consistently Only occasionally does appear to have excessive correction with boluses for high readings Also it is discussed above he does not use a temporary basal for increased activity at home but only at work; this also is usually 50%  He does need to be on the CGM but have been unable to get this approved by insurance His last C-peptide was 1.39  HYPOGLYCEMIA is still occurring fairly frequently in either with activity are possibly because of higher basal rate in the early evenings POSTPRANDIAL readings do not appear to be consistently high or low  Day-to-day management discussed in detail and also discussed potential for newer technology to help him manage his sugars  Recommendations:  Basal rate to be increased until about early afternoon and reduced after about 6 PM  Continued to have low fat meals  Carbohydrate coverage 1:54 lunch and dinner both  He will need to use temporary basal down to 25% when he is active at work and also when he is very active at home  He will get a C-peptide checked today, blood sugar is about 144 today  This will allow him to get coverage  for his DexCom more FreeStyle Libre sensor   Basal rate changes: 5 AM = 1.2, 7 AM = 1.45, 10 AM = 1.15, 5 PM = 1.35 and 8:30 PM = 1.60  Hyperlipidemia: He is supposed to be following up with his PCP but last LDL was high and Zetia recommended, still only on 20 mg Crestor   Counseling time on subjects discussed above is over 50% of today's 25 minute visit   Sanaya Gwilliam 06/27/2017, 10:26 AM   Note: This office note was prepared with Insurance underwriterDragon voice recognition system technology. Any transcriptional errors that result from this process are unintentional.

## 2017-06-28 LAB — C-PEPTIDE: C-Peptide: 0.1 ng/mL — ABNORMAL LOW (ref 1.1–4.4)

## 2017-07-05 ENCOUNTER — Ambulatory Visit: Payer: Medicare Other | Admitting: Orthopaedic Surgery

## 2017-07-05 ENCOUNTER — Telehealth: Payer: Self-pay | Admitting: Nutrition

## 2017-07-05 ENCOUNTER — Encounter: Payer: Self-pay | Admitting: Orthopaedic Surgery

## 2017-07-05 NOTE — Telephone Encounter (Signed)
Thanks, however his insurance is Medicare I believe and may not get the 670 pump

## 2017-07-05 NOTE — Telephone Encounter (Signed)
Discussed options with Alex Tucker.  Told him that because he is now a type1 diabetic, he can upgrade to the Medtronic pump with sensor, which sends readings to the pump, and the pump responds when high/low to correct those readings.  Or he can order a Dexcom which send readings to his phone and he decides what to do with those readings He is going to discuss this with his wife and let me know what he wants to do.  He was told to call the [employer] number on the back of his pump for information on upgrading his pump

## 2017-07-06 NOTE — Telephone Encounter (Signed)
Patient stated that [employer] need a letter stating patient dx code has changed from type 2 diabetes to type 1 diabetes, they need a reason why he need to be put on the pump. Please advise

## 2017-07-11 ENCOUNTER — Encounter: Payer: Self-pay | Admitting: Orthopaedic Surgery

## 2017-07-11 ENCOUNTER — Telehealth: Payer: Self-pay | Admitting: *Deleted

## 2017-07-11 ENCOUNTER — Ambulatory Visit (INDEPENDENT_AMBULATORY_CARE_PROVIDER_SITE_OTHER): Payer: PRIVATE HEALTH INSURANCE | Admitting: Orthopaedic Surgery

## 2017-07-11 VITALS — BP 105/70 | HR 63 | Temp 97.2°F | Ht 64.0 in | Wt 169.0 lb

## 2017-07-11 DIAGNOSIS — M25511 Pain in right shoulder: Secondary | ICD-10-CM

## 2017-07-11 DIAGNOSIS — M7701 Medial epicondylitis, right elbow: Secondary | ICD-10-CM | POA: Diagnosis not present

## 2017-07-11 DIAGNOSIS — G8929 Other chronic pain: Secondary | ICD-10-CM | POA: Diagnosis not present

## 2017-07-11 DIAGNOSIS — F17210 Nicotine dependence, cigarettes, uncomplicated: Secondary | ICD-10-CM | POA: Diagnosis not present

## 2017-07-11 MED ORDER — NAPROXEN 500 MG PO TABS: 500.0000 mg | ORAL_TABLET | Freq: Two times a day (BID) | ORAL | 5 refills | Status: AC

## 2017-07-11 NOTE — Progress Notes (Signed)
Subjective:    Patient ID: Alex Tucker, male    DOB: 01/21/1971, 46 y.o.   MRN: [id number]  HPI He has problems with his right shoulder and both medial elbows, more on the right side.  He works at [employer].  He does a lot of lifting every day.  He has no trauma.  He has pain with overhead use of the right shoulder.  This has been going on for months.  He has more pain as the weather has gotten cooler.  He has no numbness, no swelling, no redness.  The elbow pain depends on what he lifts.  He has more pain of the right shoulder now.  He has seen Dr. Selena BattenKim for this and had x-rays done 05-26-17 which were negative of the shoulder and elbow.    I have reviewed the X-rays the xray reports.  Dr. Elmyra RicksKim's notes have been sent also.  He has tried ibuprofen, ice, heat, rest, rubs with no help.   He is a diabetic on an insulin pump.  Review of Systems  HENT: Negative for congestion.   Respiratory: Negative for cough and shortness of breath.   Cardiovascular: Negative for chest pain and leg swelling.  Endocrine: Negative for cold intolerance.  Musculoskeletal: Positive for arthralgias.  Allergic/Immunologic: Negative for environmental allergies.   Past Medical History:  Diagnosis Date  . Diabetes mellitus without complication (HCC)   . GERD (gastroesophageal reflux disease)   . High cholesterol   . Seizures (HCC)   . TBI (traumatic brain injury) [facility](HCC)     Past Surgical History:  Procedure Laterality Date  . HAND SURGERY      Current Outpatient Medications on File Prior to Visit  Medication Sig Dispense Refill  . atenolol (TENORMIN) 50 MG tablet Take 100 mg by mouth daily.     . citalopram (CELEXA) 40 MG tablet Take 40 mg by mouth.    [name]. glucagon (GLUCAGEN HYPOKIT) 1 MG SOLR injection Inject 1 mg into the vein once as needed for low blood sugar. 1 each 5  . insulin lispro (HUMALOG) 100 UNIT/ML injection Inject into the skin.    [name]. levETIRAcetam (KEPPRA) 1000 MG tablet Take 2,000  mg by mouth.    [name]. omeprazole (PRILOSEC) 40 MG capsule Take 40 mg by mouth.    . rosuvastatin (CRESTOR) 20 MG tablet Take 1 tablet (20 mg total) by mouth daily. 90 tablet 3  . Continuous Blood Gluc Receiver (FREESTYLE LIBRE READER) DEVI 1 Device by Does not apply route as directed. (Patient not taking: Reported on 07/11/2017) 1 Device 0  . Continuous Blood Gluc Sensor (FREESTYLE LIBRE SENSOR SYSTEM) MISC 1 strip by Does not apply route once a week. Apply to upper arm and change sensor every 10 days (Patient not taking: Reported on 02/08/2017) 3 each 3   No current facility-administered medications on file prior to visit.     Social History   Socioeconomic History  . Marital status: Married    Spouse name: Not on file  . Number of children: Not on file  . Years of education: Not on file  . Highest education level: Not on file  Social Needs  . Financial resource strain: Not on file  . Food insecurity - worry: Not on file  . Food insecurity - inability: Not on file  . Transportation needs - medical: Not on file  . Transportation needs - non-medical: Not on file  Occupational History  . Not on file  Tobacco  Use  . Smoking status: Current Every Day Smoker    Packs/day: 0.50  . Smokeless tobacco: Never Used  Substance and Sexual Activity  . Alcohol use: No  . Drug use: No  . Sexual activity: Not on file  Other Topics Concern  . Not on file  Social History Narrative  . Not on file    Family History  Problem Relation Age of Onset  . Diabetes Mother   . Diabetes Paternal Grandmother   . Diabetes Paternal Grandfather     BP 105/70   Pulse 63   Temp (!) 97.2 F (36.2 C)   Ht 5\' 4"  (1.626 m)   Wt 169 lb (76.7 kg)   BMI 29.01 kg/m       Objective:   Physical Exam  Constitutional: He is oriented to person, place, and time. He appears well-developed and well-nourished.  HENT:  Head: Normocephalic and atraumatic.  Eyes: Conjunctivae and EOM are normal. Pupils are equal,  round, and reactive to light.  Neck: Normal range of motion. Neck supple.  Cardiovascular: Normal rate, regular rhythm and intact distal pulses.  Pulmonary/Chest: Effort normal.  Abdominal: Soft.  Musculoskeletal: He exhibits tenderness (right shoulder tender, nearly full ROM but tender in the extremes, pain both medial epicondyles, more on right, NV intact, ROM full, no redness, no swelling, grips normal.).  Neurological: He is alert and oriented to person, place, and time. He has normal reflexes. He displays normal reflexes. No cranial nerve deficit. He exhibits normal muscle tone. Coordination normal.  Skin: Skin is warm and dry.  Psychiatric: He has a normal mood and affect. His behavior is normal. Judgment and thought content normal.  Vitals reviewed.         Assessment & Plan:   Encounter Diagnoses  Name Primary?  . Pain in joint of right shoulder Yes  . Chronic right shoulder pain   . Cigarette nicotine dependence without complication   . Medial epicondylitis of right elbow    PROCEDURE NOTE:  The patient request injection, verbal consent was obtained.  The right shoulder was prepped appropriately after time out was performed.   Sterile technique was observed and injection of 1 cc of Depo-Medrol 40 mg with several cc's of plain xylocaine. Anesthesia was provided by ethyl chloride and a 20-gauge needle was used to inject the shoulder area. A posterior approach was used.  The injection was tolerated well.  A band aid dressing was applied.  The patient was advised to apply ice later today and tomorrow to the injection sight as needed.  I will schedule for MRI of the right shoulder as he has not improved with conservative treatment.  I will begin Naprosyn.  Precautions discussed.  Return after MRI.  Call if any problem.  Precautions discussed.   Electronically Signed Darreld McleanWayne Sausha Raymond, MD 11/20/20182:24 PM

## 2017-07-11 NOTE — Patient Instructions (Signed)
Steps to Quit Smoking Smoking tobacco can be bad for your health. It can also affect almost every organ in your body. Smoking puts you and people around you at risk for many serious long-lasting (chronic) diseases. Quitting smoking is hard, but it is one of the best things that you can do for your health. It is never too late to quit. What are the benefits of quitting smoking? When you quit smoking, you lower your risk for getting serious diseases and conditions. They can include:  Lung cancer or lung disease.  Heart disease.  Stroke.  Heart attack.  Not being able to have children (infertility).  Weak bones (osteoporosis) and broken bones (fractures).  If you have coughing, wheezing, and shortness of breath, those symptoms may get better when you quit. You may also get sick less often. If you are pregnant, quitting smoking can help to lower your chances of having a baby of low birth weight. What can I do to help me quit smoking? Talk with your doctor about what can help you quit smoking. Some things you can do (strategies) include:  Quitting smoking totally, instead of slowly cutting back how much you smoke over a period of time.  Going to in-person counseling. You are more likely to quit if you go to many counseling sessions.  Using resources and support systems, such as: ? Online chats with a counselor. ? Phone quitlines. ? Printed self-help materials. ? Support groups or group counseling. ? Text messaging programs. ? Mobile phone apps or applications.  Taking medicines. Some of these medicines may have nicotine in them. If you are pregnant or breastfeeding, do not take any medicines to quit smoking unless your doctor says it is okay. Talk with your doctor about counseling or other things that can help you.  Talk with your doctor about using more than one strategy at the same time, such as taking medicines while you are also going to in-person counseling. This can help make  quitting easier. What things can I do to make it easier to quit? Quitting smoking might feel very hard at first, but there is a lot that you can do to make it easier. Take these steps:  Talk to your family and friends. Ask them to support and encourage you.  Call phone quitlines, reach out to support groups, or work with a counselor.  Ask people who smoke to not smoke around you.  Avoid places that make you want (trigger) to smoke, such as: ? Bars. ? Parties. ? Smoke-break areas at work.  Spend time with people who do not smoke.  Lower the stress in your life. Stress can make you want to smoke. Try these things to help your stress: ? Getting regular exercise. ? Deep-breathing exercises. ? Yoga. ? Meditating. ? Doing a body scan. To do this, close your eyes, focus on one area of your body at a time from head to toe, and notice which parts of your body are tense. Try to relax the muscles in those areas.  Download or buy apps on your mobile phone or tablet that can help you stick to your quit plan. There are many free apps, such as QuitGuide from the CDC (Centers for Disease Control and Prevention). You can find more support from smokefree.gov and other websites.  This information is not intended to replace advice given to you by your health care provider. Make sure you discuss any questions you have with your health care provider. Document Released: 06/04/2009 Document   Revised: 04/05/2016 Document Reviewed: 12/23/2014 Elsevier Interactive Patient Education  2018 Elsevier Inc.  

## 2017-07-11 NOTE — Telephone Encounter (Signed)
Dr. Hilda LiasKeeling, the MRI request for right shoulder did not meet medical criteria for [employer]. They would not tell me why but I think it is because patient has not had Physical Therapy. Do you want to order? You can do Peer to Peer [phone]

## 2017-07-14 ENCOUNTER — Ambulatory Visit (HOSPITAL_COMMUNITY)
Admission: RE | Admit: 2017-07-14 | Discharge: 2017-07-14 | Disposition: A | Discharge status: Home / Self Care | Payer: PRIVATE HEALTH INSURANCE | Source: Ambulatory Visit | Attending: Orthopaedic Surgery | Admitting: Orthopaedic Surgery

## 2017-07-14 DIAGNOSIS — M25511 Pain in right shoulder: Secondary | ICD-10-CM | POA: Insufficient documentation

## 2017-07-14 DIAGNOSIS — R937 Abnormal findings on diagnostic imaging of other parts of musculoskeletal system: Secondary | ICD-10-CM | POA: Diagnosis not present

## 2017-07-14 DIAGNOSIS — M19011 Primary osteoarthritis, right shoulder: Secondary | ICD-10-CM | POA: Insufficient documentation

## 2017-07-14 DIAGNOSIS — G8929 Other chronic pain: Secondary | ICD-10-CM | POA: Diagnosis not present

## 2017-07-18 ENCOUNTER — Encounter: Payer: Self-pay | Admitting: Orthopaedic Surgery

## 2017-07-18 ENCOUNTER — Ambulatory Visit (INDEPENDENT_AMBULATORY_CARE_PROVIDER_SITE_OTHER): Payer: PRIVATE HEALTH INSURANCE | Admitting: Orthopaedic Surgery

## 2017-07-18 VITALS — BP 102/61 | HR 64 | Temp 96.9°F | Ht 64.0 in | Wt 173.0 lb

## 2017-07-18 DIAGNOSIS — F17210 Nicotine dependence, cigarettes, uncomplicated: Secondary | ICD-10-CM | POA: Diagnosis not present

## 2017-07-18 DIAGNOSIS — M25511 Pain in right shoulder: Secondary | ICD-10-CM

## 2017-07-18 NOTE — Progress Notes (Signed)
Patient ZO:XWRUE:Alex Tucker, male DOB:07/28/1971, 46 y.o. AVW:[id number]  Chief Complaint  Patient presents with  . Results    MRI Right Shoulder    HPI  Alex Tucker is a 46 y.o. male who has continued right shoulder pain.  He had MRI done and it shows: IMPRESSION: Linear increased T2 signal within the posterior aspect of the superior labrum may be due to a small SLAP type 2 tear.  Intact and normal appearing rotator cuff and long head of biceps.  Mild acromioclavicular osteoarthritis.  I have explained the findings.  I will have him see Dr. Romeo AppleHarrison for possible shoulder surgery.  Patient is agreeable. HPI  Body mass index is 29.7 kg/m.  ROS  Review of Systems  HENT: Negative for congestion.   Respiratory: Negative for cough and shortness of breath.   Cardiovascular: Negative for chest pain and leg swelling.  Endocrine: Negative for cold intolerance.  Musculoskeletal: Positive for arthralgias.  Allergic/Immunologic: Negative for environmental allergies.    Past Medical History:  Diagnosis Date  . Diabetes mellitus without complication (HCC)   . GERD (gastroesophageal reflux disease)   . High cholesterol   . Seizures (HCC)   . TBI (traumatic brain injury) [facility](HCC)     Past Surgical History:  Procedure Laterality Date  . HAND SURGERY      Family History  Problem Relation Age of Onset  . Diabetes Mother   . Diabetes Paternal Grandmother   . Diabetes Paternal Grandfather     Social History Social History   Tobacco Use  . Smoking status: Current Every Day Smoker    Packs/day: 0.50  . Smokeless tobacco: Never Used  Substance Use Topics  . Alcohol use: No  . Drug use: No    Allergies  Allergen Reactions  . Codeine Itching    Current Outpatient Medications  Medication Sig Dispense Refill  . atenolol (TENORMIN) 50 MG tablet Take 100 mg by mouth daily.     . citalopram (CELEXA) 40 MG tablet Take 40 mg by mouth.    . Continuous Blood Gluc Receiver  (FREESTYLE LIBRE READER) DEVI 1 Device by Does not apply route as directed. (Patient not taking: Reported on 07/11/2017) 1 Device 0  . Continuous Blood Gluc Sensor (FREESTYLE LIBRE SENSOR SYSTEM) MISC 1 strip by Does not apply route once a week. Apply to upper arm and change sensor every 10 days (Patient not taking: Reported on 02/08/2017) 3 each 3  . glucagon (GLUCAGEN HYPOKIT) 1 MG SOLR injection Inject 1 mg into the vein once as needed for low blood sugar. 1 each 5  . insulin lispro (HUMALOG) 100 UNIT/ML injection Inject into the skin.    [name]. levETIRAcetam (KEPPRA) 1000 MG tablet Take 2,000 mg by mouth.    . naproxen (NAPROSYN) 500 MG tablet Take 1 tablet (500 mg total) by mouth 2 (two) times daily with a meal. 60 tablet 5  . omeprazole (PRILOSEC) 40 MG capsule Take 40 mg by mouth.    . rosuvastatin (CRESTOR) 20 MG tablet Take 1 tablet (20 mg total) by mouth daily. 90 tablet 3   No current facility-administered medications for this visit.      Physical Exam  Blood pressure 102/61, pulse 64, temperature (!) 96.9 F (36.1 C), height 5\' 4"  (1.626 m), weight 173 lb (78.5 kg).  Constitutional: overall normal hygiene, normal nutrition, well developed, normal grooming, normal body habitus. Assistive device:none  Musculoskeletal: gait and station Limp none, muscle tone and strength are normal, no tremors  or atrophy is present.  .  Neurological: coordination overall normal.  Deep tendon reflex/nerve stretch intact.  Sensation normal.  Cranial nerves II-XII intact.   Skin:   Normal overall no scars, lesions, ulcers or rashes. No psoriasis.  Psychiatric: Alert and oriented x 3.  Recent memory intact, remote memory unclear.  Normal mood and affect. Well groomed.  Good eye contact.  Cardiovascular: overall no swelling, no varicosities, no edema bilaterally, normal temperatures of the legs and arms, no clubbing, cyanosis and good capillary refill.  Lymphatic: palpation is normal.  All other  systems reviewed and are negative   Right shoulder is tender. He has full motion but pain in the extremes.  NV intact.  Grips normal.  The patient has been educated about the nature of the problem(s) and counseled on treatment options.  The patient appeared to understand what I have discussed and is in agreement with it.  Encounter Diagnoses  Name Primary?  . Pain in joint of right shoulder Yes  . Cigarette nicotine dependence without complication     PLAN Call if any problems.  Precautions discussed.  Continue current medications.   Return to clinic to see Dr. Romeo AppleHarrison for shoulder surgery evaluation.   Electronically Signed Darreld McleanWayne Georgianne Gritz, MD 11/27/20188:53 AM

## 2017-07-26 ENCOUNTER — Ambulatory Visit (INDEPENDENT_AMBULATORY_CARE_PROVIDER_SITE_OTHER): Payer: Medicare Other | Admitting: Orthopedic Surgery

## 2017-07-26 ENCOUNTER — Encounter: Payer: Self-pay | Admitting: Orthopedic Surgery

## 2017-07-26 ENCOUNTER — Telehealth: Payer: Self-pay | Admitting: Radiology

## 2017-07-26 VITALS — BP 103/63 | HR 66 | Ht 64.0 in | Wt 170.0 lb

## 2017-07-26 DIAGNOSIS — M25511 Pain in right shoulder: Secondary | ICD-10-CM

## 2017-07-26 DIAGNOSIS — M7701 Medial epicondylitis, right elbow: Secondary | ICD-10-CM | POA: Diagnosis not present

## 2017-07-26 DIAGNOSIS — G8929 Other chronic pain: Secondary | ICD-10-CM

## 2017-07-26 DIAGNOSIS — S43431A Superior glenoid labrum lesion of right shoulder, initial encounter: Secondary | ICD-10-CM

## 2017-07-26 DIAGNOSIS — F17210 Nicotine dependence, cigarettes, uncomplicated: Secondary | ICD-10-CM | POA: Diagnosis not present

## 2017-07-26 NOTE — Progress Notes (Signed)
PREOP CONSULT   Patient ID: Alex Tucker, male   DOB: 10/28/1970, 46 y.o.   MRN: [id number]  Chief Complaint  Patient presents with  . Shoulder Pain    right     HPI Alex Tucker is a 46 y.o. male.  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  He had a subacromial injection and actually got worse.  He says he cannot throw a ball anymore and he has trouble with overhead activity.  He also has difficulty sleeping on the right shoulder and it wakes him up at night   Review of Systems Review of Systems  Constitutional: Negative.   Respiratory: Negative.   Cardiovascular: Negative.   Musculoskeletal: Negative for neck pain.  Neurological: Negative for weakness and numbness.     Past Medical History:  Diagnosis Date  . Diabetes mellitus without complication (HCC)   . GERD (gastroesophageal reflux disease)   . High cholesterol   . Seizures (HCC)   . TBI (traumatic brain injury) [facility](HCC)     Past Surgical History:  Procedure Laterality Date  . HAND SURGERY      Family History  Problem Relation Age of Onset  . Diabetes Mother   . Diabetes Paternal Grandmother   . Diabetes Paternal Grandfather     Social History Social History   Tobacco Use  . Smoking status: Current Every Day Smoker    Packs/day: 0.50  . Smokeless tobacco: Never Used  Substance Use Topics  . Alcohol use: No  . Drug use: No    Allergies  Allergen Reactions  . Codeine Itching    Current Outpatient Medications  Medication Sig Dispense Refill  . atenolol (TENORMIN) 50 MG tablet Take 100 mg by mouth daily.     [name]. atorvastatin (LIPITOR) 20 MG tablet TK 1 T PO HS FOR CHOLESTEROL  1  . citalopram (CELEXA) 40 MG tablet Take 40 mg by mouth.    . Continuous Blood  Gluc Receiver (FREESTYLE LIBRE READER) DEVI 1 Device by Does not apply route as directed. (Patient not taking: Reported on 07/11/2017) 1 Device 0  . Continuous Blood Gluc Sensor (FREESTYLE LIBRE SENSOR SYSTEM) MISC 1 strip by Does not apply route once a week. Apply to upper arm and change sensor every 10 days (Patient not taking: Reported on 02/08/2017) 3 each 3  . glucagon (GLUCAGEN HYPOKIT) 1 MG SOLR injection Inject 1 mg into the vein once as needed for low blood sugar. 1 each 5  . insulin lispro (HUMALOG) 100 UNIT/ML injection Inject into the skin.    [name]. levETIRAcetam (KEPPRA) 1000 MG tablet Take 2,000 mg by mouth.    . naproxen (NAPROSYN) 500 MG tablet Take 1 tablet (500 mg total) by mouth 2 (two) times daily with a meal. 60 tablet 5  . omeprazole (PRILOSEC) 40 MG capsule Take 40 mg by mouth.    . rosuvastatin (CRESTOR) 20 MG tablet Take 1 tablet (20 mg total) by mouth daily. 90 tablet 3   No current facility-administered medications for this visit.        Physical Exam BP 103/63   Pulse 66   Ht 5\' 4"  (1.626 m)   Wt 170 lb (77.1 kg)   BMI 29.18 kg/m  Physical Exam  Constitutional: He is oriented  to person, place, and time. He appears well-developed and well-nourished.  HENT:  Head: Normocephalic and atraumatic.  Eyes: Conjunctivae and EOM are normal. Pupils are equal, round, and reactive to light.  Neck: Normal range of motion and full passive range of motion without pain. Neck supple. No JVD present. No spinous process tenderness and no muscular tenderness present. No neck rigidity. No tracheal deviation and normal range of motion present. No thyromegaly present.    Cardiovascular: Normal rate, regular rhythm and intact distal pulses.  Pulmonary/Chest: Effort normal. No stridor.  Abdominal: Soft.  Lymphadenopathy:    He has no cervical adenopathy.  Neurological: He is alert and oriented to person, place, and time. He has normal reflexes. No cranial nerve deficit. He exhibits  normal muscle tone. Coordination normal.  Skin: Skin is warm and dry.  Psychiatric: He has a normal mood and affect. His behavior is normal. Judgment and thought content normal.   Right Shoulder Exam  Right shoulder exam is normal.  Tenderness  Right shoulder tenderness location: mild AC joint, rotator interval   Range of Motion  The patient has normal right shoulder ROM.  Muscle Strength  The patient has normal right shoulder strength.  Tests  Apprehension: negative Hawkins test: positive Cross arm: positive Impingement: negative Drop arm: negative Sulcus: absent  Other  Erythema: absent Sensation: normal Pulse: present  Comments:  Speed test + yerguson test - Obriens compression test +   Left Shoulder Exam  Left shoulder exam is normal.  Tenderness  The patient is experiencing no tenderness.   Range of Motion  The patient has normal left shoulder ROM.  Muscle Strength  The patient has normal left shoulder strength.  Tests  Apprehension: negative  Other  Erythema: absent Sensation: normal Pulse: present         Data Reviewed   Dr. Mort Sawyers own interpretation of the imaging Plain films 3 views right shoulder for right shoulder pain.  Acromion type I.  Glenohumeral joint normal mild sclerosis at the greater tuberosity.  Axillary view shows no erosion of the humeral head or the glenoid  MRI shows questionable SLAP lesion with mild acromial clavicular arthrosis   Copies of the reports are included X-RAYS :   FINDINGS: There is no evidence of fracture or dislocation. There is no evidence of arthropathy or other focal bone abnormality. Soft tissues are unremarkable.  IMPRESSION: Negative.   Electronically Signed   By: Jasmine Pang M.D.   On: 05/26/2017 15:15  MRI   Labrum: Linear increased T2 signal is seen within the labrum from 11 o'clock to the 10 o'clock position superiorly which may be due to a tear. Otherwise  unremarkable. IMPRESSION: Linear increased T2 signal within the posterior aspect of the superior labrum may be due to a small SLAP type 2 tear.   Intact and normal appearing rotator cuff and long head of biceps.   Mild acromioclavicular osteoarthritis.     Electronically Signed   By: Drusilla Kanner M.D.   On: 07/14/2017 17:24   Encounter Diagnoses  Name Primary?  . Cigarette nicotine dependence without complication   . Chronic right shoulder pain   . Medial epicondylitis of right elbow   . Pain in joint of right shoulder   . SLAP lesion, type II, nontraumatic, right, initial encounter Yes         Plan  After review of all the data and history and physical the patient has opted for surgery versus continued physical therapy.  We  discussed that he may not have any pathology in the shoulder as the MRI is questionable whether the SLAP tear is present.  I have advised him he may have a biceps tenodesis versus an actual SLAP repair and we will address the acromio clavicular joint if needed   Surgical arthroscopy right shoulder with repair of SLAP lesion versus biceps tenodesis   Slap repair [id number]    [id number] biceps tenodesis  The procedure has been fully reviewed with the patient; The risks and benefits of surgery have been discussed and explained and understood. Alternative treatment has also been reviewed, questions were encouraged and answered. The postoperative plan is also been reviewed.

## 2017-07-26 NOTE — Telephone Encounter (Signed)
[facility]

## 2017-07-26 NOTE — Telephone Encounter (Signed)
Called [employer] spoke to [place]ie [place] [phone] does require prior auth, fax to [facility] [phone] fax [phone] they have form to fill out [email or web address]

## 2017-07-26 NOTE — Telephone Encounter (Signed)
I called  medcost spoke to [place] and was advised no precert required for [place] 29807/ use her name and todays date as ref # if needed later  [facility] same. No one answered after 15 minutes, will try again later.

## 2017-07-26 NOTE — Patient Instructions (Addendum)
SLAP Lesions Superior labrum anterior posterior (SLAP) lesions are injuries to part of the connective tissue (cartilage) of the shoulder joint. The top of the upper arm bone (humerus) fits into a socket in the shoulder blade to form the shoulder joint. There is a firm rim of cartilage (labrum) around the edge of the socket. The labrum helps to deepen the socket and hold the humerus in place. If a certain part of the labrum becomes frayed or torn, it is called a SLAP lesion. A SLAP lesion can cause shoulder pain, instability, and weakness. SLAP lesions are common among athletes who play sports that involve repeated overhead movements. SLAP lesions may include a tear in the cord of tissue that attaches the muscle in the front of the upper arm to the shoulder blade (proximal biceps tendon). What are the causes? This condition may be caused by:  A sudden (acute) injury, which can result from: ? Falling on an outstretched arm. ? Movement of the shoulder joint out of its normal place (dislocation). ? A direct hit to the shoulder.  Wear and tear over time, which can result from doing activities or sports that involve overhead arm movements.  What increases the risk? The following factors may make you more likely to develop a SLAP lesion:  Having had a dislocated shoulder in the past.  Being age 45 or older.  Playing certain sports, such as: ? Sports that involve repeated overhead movements, such as baseball or volleyball. ? Sports that put backward pressure on the arms when the arms are overhead, such as gymnastics or basketball. ? Contact sports.  Lifting weights.  What are the signs or symptoms? The main symptom of this condition is shoulder pain that gets worse when lifting a heavy object or raising the arm overhead. Other signs and symptoms may include:  Feeling like your shoulder is locking, catching, grinding, or popping.  Loss of strength.  Stiffness and limited range of  motion.  Loss of throwing power ("dead arm").  How is this diagnosed? This condition may be diagnosed based on:  Your symptoms.  Your medical history.  A physical exam.  Imaging tests, such as MRIs.  How is this treated? Treatment for this condition may include:  Resting your shoulder by avoiding activities that cause shoulder pain.  NSAIDs to help reduce pain and swelling.  Physical therapy to improve strength and range of motion.  Surgery. This may be done if other treatment methods do not help. Surgery may involve: ? Removing frayed pieces of the labrum. ? Repairing tears. ? Reattaching the labrum. ? Repairing the biceps tendon.  Follow these instructions at home: Managing pain, stiffness, and swelling  If directed, put ice on the injured area. ? Put ice in a plastic bag. ? Place a towel between your skin and the bag. ? Leave the ice on for 20 minutes, 2-3 times a day. Driving  Do not drive or operate heavy machinery while taking prescription pain medicine.  Ask your health care provider when it is safe for you to drive. Activity  Return to your normal activities as told by your health care provider. Ask your health care provider what activities are safe for you.  Do exercises as told by your health care provider. General instructions   Do not use any tobacco products, such as cigarettes, chewing tobacco, or e-cigarettes. Tobacco can delay bone healing. If you need help quitting, ask your health care provider.  Take over-the-counter and prescription medicines only as told  by your health care provider.  Keep all follow-up visits as told by your health care provider. This is important. How is this prevented?  Be safe and responsible while being active to avoid falls.  Maintain physical fitness, including strength and flexibility. Contact a health care provider if:  Your symptoms have not improved after 6 months of treatment.  Your symptoms get worse  instead of getting better. This information is not intended to replace advice given to you by your health care provider. Make sure you discuss any questions you have with your health care provider. Document Released: 08/08/2005 Document Revised: 04/14/2016 Document Reviewed: 07/11/2015 Elsevier Interactive Patient Education  2018 [employer]   You have decided to proceed with surgery right shoulder for repair of the torn labrum. You have decided not to continue with nonoperative measures such as but not limited to oral medication,   activity modification, physical therapy, or injection.  We will perform arthroscopy of the right shoulder and repair or tenodesis  of the biceps tendon. some of the risks associated with the surgery include but are not limited to Bleeding Infection Swelling Stiffness Blood clot Pain Re-tearing of the tendon   Failure of the rotator cuff to heal   If you're not comfortable with these risks and would like to continue with nonoperative treatment please let Dr. Romeo AppleHarrison know prior to your surgery.

## 2017-07-27 ENCOUNTER — Telehealth: Payer: Self-pay | Admitting: Radiology

## 2017-07-27 NOTE — Telephone Encounter (Signed)
[place] with [facility] [place] approved the surgery for patient Alex Tucker Auth Ref # [id number] good 07/27/17-08/26/17

## 2017-07-27 NOTE — Telephone Encounter (Signed)
Patients [employer] called regarding notes, wanting to know if patient has had physical therapy for the shoulder, and if he has needing notes faxed, I have reviewed, and no Physical therapy has been ordered. Have advised [employer]. This is still in review.

## 2017-07-28 ENCOUNTER — Telehealth: Payer: Self-pay | Admitting: Endocrinology

## 2017-07-28 NOTE — Telephone Encounter (Signed)
Pt called. Pt states he put on last pump today and he has supply for three days. Pt states cannot fill until 08/01/17. Pt request scripts to be for more because he is at the edge of being out every time  Pt uses [facility] in [place]. Please advise.

## 2017-07-28 NOTE — Patient Instructions (Signed)
Alex Tucker  07/28/2017     @[email or web address] @   Your procedure is scheduled on  08/03/2017   Report to [facility] at  1020  A.M.  Call this number if you have problems the morning of surgery:  [phone]   Remember:  Do not eat food or drink liquids after midnight.  Take these medicines the morning of surgery with A SIP OF WATER  Atenolol, celexa, keppra, prilosec.   Do not wear jewelry, make-up or nail polish.  Do not wear lotions, powders, or perfumes, or deoderant.  Do not shave 48 hours prior to surgery.  Men may shave face and neck.  Do not bring valuables to the hospital.  [facility] is not responsible for any belongings or valuables.  Contacts, dentures or bridgework may not be worn into surgery.  Leave your suitcase in the car.  After surgery it may be brought to your room.  For patients admitted to the hospital, discharge time will be determined by your treatment team.  Patients discharged the day of surgery will not be allowed to drive home.   Name and phone number of your driver:   family Special instructions:  None  Please read over the following fact sheets that you were given. Anesthesia Post-op Instructions and Care and Recovery After Surgery       Shoulder Arthroscopy Shoulder arthroscopy is a surgical technique used to evaluate and treat injuries involving the shoulder joint. In this technique, small cuts (incisions) are made in your shoulder. A small, telescope-like instrument with a lighted camera on one end (arthroscope) and surgical instruments are inserted through these incisions into your shoulder joint. This allows your health care provider to look directly into the joint and repair any damage at the same time. Tell a health care provider about:  Any allergies you have.  All medicines you are taking, including vitamins, herbs, eye drops, creams, and over-the-counter medicines.  Any problems you or family members have  had with anesthetic medicines.  Any blood disorders you have.  Any surgeries you have had.  Any medical conditions you have. What are the risks? Generally, this is a safe procedure. However, problems can occur and include:  Damage to nerves or blood vessels.  Excess bleeding.  Blood clots.  Infection.  What happens before the procedure?  Ask your health care provider about: ? Changing or stopping your regular medicines. This is especially important if you are taking diabetes medicines or blood thinners. ? Taking medicines such as aspirin and ibuprofen. These medicines can thin your blood. Do not take these medicines before your procedure if your health care provider asks you not to.  Do not eat or drink anything after midnight on the night before the procedure or as directed by your health care provider.  You may have an exam or testing. What happens during the procedure?  You may be given a medicine to make you sleep (general anesthetic) or a medicine to numb the shoulder area (local anesthetic).  Several small incisions will be made in your shoulder. Saline fluid will be put in through one of the incisions to expand the joint space so your health care provider can see the area more easily.  An arthroscope will be inserted into one of the incisions. The arthroscope sends an image to a TV screen so the health care provider can examine your shoulder joint.  During the procedure, your health  care provider may find various problems.  Tools may be inserted through the other incisions to repair any injuries found. In some cases, the procedure may change to an open surgery if problems are found that cannot be repaired with arthroscopy.  The incisions will then be closed with stitches or tape. What happens after the procedure? You will be taken to a recovery area where your progress will be watched. This information is not intended to replace advice given to you by your health care  provider. Make sure you discuss any questions you have with your health care provider. Document Released: 03/05/2014 Document Revised: 04/03/2016 Document Reviewed: 09/27/2013 Elsevier Interactive Patient Education  2017 [employer] Shoulder Arthroscopy, Care After Refer to this sheet in the next few weeks. These instructions provide you with information on caring for yourself after your procedure. Your health care provider may also give you more specific instructions. Your treatment has been planned according to current medical practices, but problems sometimes occur. Call your health care provider if you have any problems or questions after your procedure. What can I expect after the procedure? After your procedure, it is typical to have the following:  Pain at the site of the surgical cuts (incisions).  Stiffness in your shoulder. This should gradually decrease over time. Your health care provider may recommend physical therapy to help improve this.  Nausea, vomiting, or constipation. These symptoms can result from taking pain medicine after surgery.  Clear or red drainage from the incision sites. This is normal for a few days after surgery.  Fatigue.  Follow these instructions at home:  Take medicines only as directed by your health care provider.  Use a sling as directed.  There are many different ways to close and cover an incision, including stitches, skin glue, and adhesive strips. Follow your health care provider's instructions on: ? Incision care. ? Bandage (dressing) changes and removal. ? Incision closure removal.  Apply ice to the injured area: ? Put ice in a plastic bag. ? Place a towel between your skin and the bag. ? Leave the ice on for 20 minutes, 2-3 times a day.  If physical therapy and exercises are prescribed by your health care provider, do them as directed.  Keep all follow-up visits as directed by your health care provider. This is important. Contact a  health care provider if: You have a fever. Get help right away if:  You have drainage, redness, swelling, or increasing pain at the incision site.  You notice a bad smell coming from the incision site or dressing.  Your incision site breaks open after your stitches or tape has been removed. This information is not intended to replace advice given to you by your health care provider. Make sure you discuss any questions you have with your health care provider. Document Released: 03/05/2014 Document Revised: 04/03/2016 Document Reviewed: 09/27/2013 Elsevier Interactive Patient Education  2017 [employer]  Biceps Tendon Disruption (Proximal) The proximal biceps tendon is a strong cord of tissue that connects the biceps muscle, on the front of the upper arm, to the shoulder blade. A proximal biceps tendon disruption can include a partial or complete tear of the tendon near where it connects to the bone near the shoulder. A proximal biceps tendon disruption can interfere with the ability to lift the arm in front of the body, stabilize the shoulder, bend the elbow, and turn the hand palm-up (supination). What are the causes? A biceps tendon disruption happens when the tendon  is exposed to too much force. This excess force may be caused by:  The elbow being suddenly straightened from a bent position because of an external force. This could happen, for example, while catching a heavy weight or being pulled when waterskiing.  Wear and tear from physical activity.  Breaking a fall with your hand.  What increases the risk? The following factors may make you more likely to develop this condition:  Playing contact sports.  Doing activities or sports that involve throwing or overhead movements, such as racket sports, gymnastics, or baseball.  Doing activities or sports that involve putting sudden force on the arm, such as weightlifting or waterskiing.  Having a weakened tendon. The tendon may be  weak because of: ? Long-lasting (chronic) biceps tendinitis. ? Certain medical conditions, such as diabetes or rheumatoid arthritis. ? Repeated corticosteroid use. ? Repetitive overhead movements.  What are the signs or symptoms? Symptoms of this condition may include:  Sudden sharp pain in the front of the shoulder. Pain may get worse during certain movements, such as: ? Lifting or carrying objects. ? Straightening the elbow. ? Throwing or using overhead movements.  Inflammation or a feeling of unusual warmth on the front of the shoulder.  Painful tightening (spasm) of the biceps muscle.  A bulge on the inside of the upper arm when the elbow is bent.  Bruising in the shoulder or upper arm. This may develop 24-48 hours after the tendon is injured.  Limited range of motion of the shoulder and elbow.  Weakness in the elbow and forearm when: ? Bending the elbow. ? Rotating the wrist.  How is this diagnosed? This condition is diagnosed based on your symptoms, your medical history, and a physical exam. Your health care provider may test the strength and range of motion of your shoulder and elbow. You may have imaging tests, such as X-rays, MRI, or ultrasound. How is this treated? This condition is treated by resting and icing the injured area, and by doing physical therapy exercises. Depending on the severity of your condition, treatment may also include:  Medicines to help relieve pain and inflammation.  Avoiding certain activities that put stress on your shoulder.  One or more injections of medicines (corticosteroids) into your upper arm to help reduce inflammation (rare).  Surgery to repair the tear. This may be needed if nonsurgical treatments do not improve your condition.  Follow these instructions at home: Managing pain, stiffness, and swelling  If directed, put ice on the injured area: ? Put ice in a plastic bag. ? Place a towel between your skin and the bag. ? Leave  the ice on for 20 minutes, 2-3 times a day.  Move your fingers often to avoid stiffness and to lessen swelling.  Raise (elevate) the injured area while you are sitting or lying down. Activity  Return to your normal activities as told by your health care provider. Ask your health care provider what activities are safe for you.  Avoid activities that cause pain or make your condition worse.  Do not lift anything that is heavier than 10 lb (4.5 kg) until your health care provider approves.  Do exercises as told by your health care provider. General instructions  Take over-the-counter and prescription medicines only as told by your health care provider.  Do not drive or operate heavy machinery while taking prescription pain medicines.  Keep all follow-up visits as told by your health care provider. This is important. How is this prevented?  Warm up and stretch before being active.  Cool down and stretch after being active.  Give your body time to rest between periods of activity.  Make sure to use equipment that fits you.  Be safe and responsible while being active to avoid falls.  Maintain physical fitness, including strength and flexibility. Contact a health care provider if:  You have symptoms that get worse or do not get better after 2 weeks of treatment.  You develop new symptoms. Get help right away if:  You have severe pain.  You develop pain or numbness in your hand.  Your hand feels unusually cold.  Your fingernails turn a dark color, such as blue or gray. This information is not intended to replace advice given to you by your health care provider. Make sure you discuss any questions you have with your health care provider. Document Released: 08/08/2005 Document Revised: 04/14/2016 Document Reviewed: 07/17/2015 Elsevier Interactive Patient Education  2018 [employer].  General Anesthesia, Adult General anesthesia is the use of medicines to make a person "go  to sleep" (be unconscious) for a medical procedure. General anesthesia is often recommended when a procedure:  Is long.  Requires you to be still or in an unusual position.  Is major and can cause you to lose blood.  Is impossible to do without general anesthesia.  The medicines used for general anesthesia are called general anesthetics. In addition to making you sleep, the medicines:  Prevent pain.  Control your blood pressure.  Relax your muscles.  Tell a health care provider about:  Any allergies you have.  All medicines you are taking, including vitamins, herbs, eye drops, creams, and over-the-counter medicines.  Any problems you or family members have had with anesthetic medicines.  Types of anesthetics you have had in the past.  Any bleeding disorders you have.  Any surgeries you have had.  Any medical conditions you have.  Any history of heart or lung conditions, such as heart failure, sleep apnea, or chronic obstructive pulmonary disease (COPD).  Whether you are pregnant or may be pregnant.  Whether you use tobacco, alcohol, marijuana, or street drugs.  Any history of Financial [employer].  Any history of depression or anxiety. What are the risks? Generally, this is a safe procedure. However, problems may occur, including:  Allergic reaction to anesthetics.  Lung and heart problems.  Inhaling food or liquids from your stomach into your lungs (aspiration).  Injury to nerves.  Waking up during your procedure and being unable to move (rare).  Extreme agitation or a state of mental confusion (delirium) when you wake up from the anesthetic.  Air in the bloodstream, which can lead to stroke.  These problems are more likely to develop if you are having a major surgery or if you have an advanced medical condition. You can prevent some of these complications by answering all of your health care provider's questions thoroughly and by following all pre-procedure  instructions. General anesthesia can cause side effects, including:  Nausea or vomiting  A sore throat from the breathing tube.  Feeling cold or shivery.  Feeling tired, washed out, or achy.  Sleepiness or drowsiness.  Confusion or agitation.  What happens before the procedure? Staying hydrated Follow instructions from your health care provider about hydration, which may include:  Up to 2 hours before the procedure - you may continue to drink clear liquids, such as water, clear fruit juice, black coffee, and plain tea.  Eating and drinking restrictions Follow instructions  from your health care provider about eating and drinking, which may include:  8 hours before the procedure - stop eating heavy meals or foods such as meat, fried foods, or fatty foods.  6 hours before the procedure - stop eating light meals or foods, such as toast or cereal.  6 hours before the procedure - stop drinking milk or drinks that contain milk.  2 hours before the procedure - stop drinking clear liquids.  Medicines  Ask your health care provider about: ? Changing or stopping your regular medicines. This is especially important if you are taking diabetes medicines or blood thinners. ? Taking medicines such as aspirin and ibuprofen. These medicines can thin your blood. Do not take these medicines before your procedure if your health care provider instructs you not to. ? Taking new dietary supplements or medicines. Do not take these during the week before your procedure unless your health care provider approves them.  If you are told to take a medicine or to continue taking a medicine on the day of the procedure, take the medicine with sips of water. General instructions   Ask if you will be going home the same day, the following day, or after a longer hospital stay. ? Plan to have someone take you home. ? Plan to have someone stay with you for the first 24 hours after you leave the hospital or  clinic.  For 3-6 weeks before the procedure, try not to use any tobacco products, such as cigarettes, chewing tobacco, and e-cigarettes.  You may brush your teeth on the morning of the procedure, but make sure to spit out the toothpaste. What happens during the procedure?  You will be given anesthetics through a mask and through an IV tube in one of your veins.  You may receive medicine to help you relax (sedative).  As soon as you are asleep, a breathing tube may be used to help you breathe.  An anesthesia specialist will stay with you throughout the procedure. He or she will help keep you comfortable and safe by continuing to give you medicines and adjusting the amount of medicine that you get. He or she will also watch your blood pressure, pulse, and oxygen levels to make sure that the anesthetics do not cause any problems.  If a breathing tube was used to help you breathe, it will be removed before you wake up. The procedure may vary among health care providers and hospitals. What happens after the procedure?  You will wake up, often slowly, after the procedure is complete, usually in a recovery area.  Your blood pressure, heart rate, breathing rate, and blood oxygen level will be monitored until the medicines you were given have worn off.  You may be given medicine to help you calm down if you feel anxious or agitated.  If you will be going home the same day, your health care provider may check to make sure you can stand, drink, and urinate.  Your health care providers will treat your pain and side effects before you go home.  Do not drive for 24 hours if you received a sedative.  You may: ? Feel nauseous and vomit. ? Have a sore throat. ? Have mental slowness. ? Feel cold or shivery. ? Feel sleepy. ? Feel tired. ? Feel sore or achy, even in parts of your body where you did not have surgery. This information is not intended to replace advice given to you by your health  care provider.  Make sure you discuss any questions you have with your health care provider. Document Released: 11/15/2007 Document Revised: 01/19/2016 Document Reviewed: 07/23/2015 Elsevier Interactive Patient Education  2018 [employer]. General Anesthesia, Adult, Care After These instructions provide you with information about caring for yourself after your procedure. Your health care provider may also give you more specific instructions. Your treatment has been planned according to current medical practices, but problems sometimes occur. Call your health care provider if you have any problems or questions after your procedure. What can I expect after the procedure? After the procedure, it is common to have:  Vomiting.  A sore throat.  Mental slowness.  It is common to feel:  Nauseous.  Cold or shivery.  Sleepy.  Tired.  Sore or achy, even in parts of your body where you did not have surgery.  Follow these instructions at home: For at least 24 hours after the procedure:  Do not: ? Participate in activities where you could fall or become injured. ? Drive. ? Use heavy machinery. ? Drink alcohol. ? Take sleeping pills or medicines that cause drowsiness. ? Make important decisions or sign legal documents. ? Take care of children on your own.  Rest. Eating and drinking  If you vomit, drink water, juice, or soup when you can drink without vomiting.  Drink enough fluid to keep your urine clear or pale yellow.  Make sure you have little or no nausea before eating solid foods.  Follow the diet recommended by your health care provider. General instructions  Have a responsible adult stay with you until you are awake and alert.  Return to your normal activities as told by your health care provider. Ask your health care provider what activities are safe for you.  Take over-the-counter and prescription medicines only as told by your health care provider.  If you smoke,  do not smoke without supervision.  Keep all follow-up visits as told by your health care provider. This is important. Contact a health care provider if:  You continue to have nausea or vomiting at home, and medicines are not helpful.  You cannot drink fluids or start eating again.  You cannot urinate after 8-12 hours.  You develop a skin rash.  You have fever.  You have increasing redness at the site of your procedure. Get help right away if:  You have difficulty breathing.  You have chest pain.  You have unexpected bleeding.  You feel that you are having a life-threatening or urgent problem. This information is not intended to replace advice given to you by your health care provider. Make sure you discuss any questions you have with your health care provider. Document Released: 11/14/2000 Document Revised: 01/11/2016 Document Reviewed: 07/23/2015 Elsevier Interactive Patient Education  [employer].

## 2017-07-31 ENCOUNTER — Other Ambulatory Visit: Payer: Self-pay

## 2017-07-31 ENCOUNTER — Encounter (HOSPITAL_COMMUNITY)
Admission: RE | Admit: 2017-07-31 | Discharge: 2017-07-31 | Disposition: A | Discharge status: Home / Self Care | Payer: PRIVATE HEALTH INSURANCE | Source: Ambulatory Visit | Attending: Orthopedic Surgery | Admitting: Orthopedic Surgery

## 2017-07-31 ENCOUNTER — Encounter (HOSPITAL_COMMUNITY): Payer: Self-pay

## 2017-07-31 DIAGNOSIS — Z791 Long term (current) use of non-steroidal anti-inflammatories (NSAID): Secondary | ICD-10-CM | POA: Diagnosis not present

## 2017-07-31 DIAGNOSIS — E7800 Pure hypercholesterolemia, unspecified: Secondary | ICD-10-CM | POA: Diagnosis not present

## 2017-07-31 DIAGNOSIS — S43431A Superior glenoid labrum lesion of right shoulder, initial encounter: Secondary | ICD-10-CM | POA: Insufficient documentation

## 2017-07-31 DIAGNOSIS — E119 Type 2 diabetes mellitus without complications: Secondary | ICD-10-CM | POA: Insufficient documentation

## 2017-07-31 DIAGNOSIS — R569 Unspecified convulsions: Secondary | ICD-10-CM | POA: Insufficient documentation

## 2017-07-31 DIAGNOSIS — Z794 Long term (current) use of insulin: Secondary | ICD-10-CM | POA: Insufficient documentation

## 2017-07-31 DIAGNOSIS — M25811 Other specified joint disorders, right shoulder: Secondary | ICD-10-CM | POA: Insufficient documentation

## 2017-07-31 DIAGNOSIS — F17210 Nicotine dependence, cigarettes, uncomplicated: Secondary | ICD-10-CM | POA: Insufficient documentation

## 2017-07-31 DIAGNOSIS — X58XXXA Exposure to other specified factors, initial encounter: Secondary | ICD-10-CM | POA: Diagnosis not present

## 2017-07-31 DIAGNOSIS — Z87820 Personal history of traumatic brain injury: Secondary | ICD-10-CM | POA: Insufficient documentation

## 2017-07-31 DIAGNOSIS — K219 Gastro-esophageal reflux disease without esophagitis: Secondary | ICD-10-CM | POA: Diagnosis not present

## 2017-07-31 DIAGNOSIS — Z79899 Other long term (current) drug therapy: Secondary | ICD-10-CM | POA: Insufficient documentation

## 2017-07-31 HISTORY — DX: Personal history of other diseases of the musculoskeletal system and connective tissue: Z87.39

## 2017-07-31 HISTORY — DX: Unspecified osteoarthritis, unspecified site: M19.90

## 2017-07-31 HISTORY — DX: Personal history of urinary calculi: Z87.442

## 2017-07-31 LAB — BASIC METABOLIC PANEL
ANION GAP: 8 (ref 5–15)
BUN: 15 mg/dL (ref 6–20)
CALCIUM: 9.3 mg/dL (ref 8.9–10.3)
CO2: 29 mmol/L (ref 22–32)
Chloride: 103 mmol/L (ref 101–111)
Creatinine, Ser: 1.03 mg/dL (ref 0.61–1.24)
GFR calc Af Amer: >60 mL/min (ref >60)
GFR calc non Af Amer: >60 mL/min (ref >60)
GLUCOSE: 129 mg/dL — AB (ref 65–99)
Potassium: 3.8 mmol/L (ref 3.5–5.1)
Sodium: 140 mmol/L (ref 135–145)

## 2017-07-31 LAB — CBC WITH DIFFERENTIAL/PLATELET
Basophils Absolute: 0.1 10*3/uL (ref 0.0–0.1)
Basophils Relative: 1 %
Eosinophils Absolute: 0.2 10*3/uL (ref 0.0–0.7)
Eosinophils Relative: 3 %
HEMATOCRIT: 43.1 % (ref 39.0–52.0)
Hemoglobin: 14.6 g/dL (ref 13.0–17.0)
LYMPHS ABS: 3.2 10*3/uL (ref 0.7–4.0)
LYMPHS PCT: 36 %
MCH: 31 pg (ref 26.0–34.0)
MCHC: 33.9 g/dL (ref 30.0–36.0)
MCV: 91.5 fL (ref 78.0–100.0)
MONO ABS: 0.9 10*3/uL (ref 0.1–1.0)
MONOS PCT: 10 %
NEUTROS ABS: 4.5 10*3/uL (ref 1.7–7.7)
Neutrophils Relative %: 50 %
Platelets: 342 10*3/uL (ref 150–400)
RBC: 4.71 MIL/uL (ref 4.22–5.81)
RDW: 12.8 % (ref 11.5–15.5)
WBC: 8.9 10*3/uL (ref 4.0–10.5)

## 2017-08-02 ENCOUNTER — Other Ambulatory Visit: Payer: Self-pay

## 2017-08-02 MED ORDER — INSULIN LISPRO 100 UNIT/ML ~~LOC~~ SOLN: 100.0000 [IU] | Freq: Every day | SUBCUTANEOUS | 3 refills | Status: DC

## 2017-08-02 NOTE — H&P (Signed)
Patient ID: Alex Tucker, male   DOB: 03/11/1971, 46 y.o.   MRN: [id number]       Chief Complaint  Patient presents with  . Shoulder Pain      right       HPI Alex Tucker is a 46 y.o. male.  Presents for evaluation of right shoulder pain which she has had for the last 6 months   46 year old male right-hand-dominant works part-time at [employer] loading trucks presents with 6157-month history of severe burning pain in his right shoulder nonradiating which did not improve with Naprosyn.  He had a subacromial injection and actually got worse.   He says he cannot throw a ball anymore and he has trouble with overhead activity.  He also has difficulty sleeping on the right shoulder and it wakes him up at night     Review of Systems Review of Systems  Constitutional: Negative.   Respiratory: Negative.   Cardiovascular: Negative.   Musculoskeletal: Negative for neck pain.  Neurological: Negative for weakness and numbness.            Past Medical History:  Diagnosis Date  . Diabetes mellitus without complication (HCC)    . GERD (gastroesophageal reflux disease)    . High cholesterol    . Seizures (HCC)    . TBI (traumatic brain injury) [facility](HCC)             Past Surgical History:  Procedure Laterality Date  . HAND SURGERY               Family History  Problem Relation Age of Onset  . Diabetes Mother    . Diabetes Paternal Grandmother    . Diabetes Paternal Grandfather        Social History Social History         Tobacco Use  . Smoking status: Current Every Day Smoker      Packs/day: 0.50  . Smokeless tobacco: Never Used  Substance Use Topics  . Alcohol use: No  . Drug use: No          Allergies  Allergen Reactions  . Codeine Itching            Current Outpatient Medications  Medication Sig Dispense Refill  . atenolol (TENORMIN) 50 MG tablet Take 100 mg by mouth daily.       [name]. atorvastatin (LIPITOR) 20 MG tablet TK 1 T PO HS FOR CHOLESTEROL   1  . citalopram  (CELEXA) 40 MG tablet Take 40 mg by mouth.      . Continuous Blood Gluc Receiver (FREESTYLE LIBRE READER) DEVI 1 Device by Does not apply route as directed. (Patient not taking: Reported on 07/11/2017) 1 Device 0  . Continuous Blood Gluc Sensor (FREESTYLE LIBRE SENSOR SYSTEM) MISC 1 strip by Does not apply route once a week. Apply to upper arm and change sensor every 10 days (Patient not taking: Reported on 02/08/2017) 3 each 3  . glucagon (GLUCAGEN HYPOKIT) 1 MG SOLR injection Inject 1 mg into the vein once as needed for low blood sugar. 1 each 5  . insulin lispro (HUMALOG) 100 UNIT/ML injection Inject into the skin.      [name]. levETIRAcetam (KEPPRA) 1000 MG tablet Take 2,000 mg by mouth.      . naproxen (NAPROSYN) 500 MG tablet Take 1 tablet (500 mg total) by mouth 2 (two) times daily with a meal. 60 tablet 5  . omeprazole (PRILOSEC) 40 MG capsule Take 40 mg by  mouth.      . rosuvastatin (CRESTOR) 20 MG tablet Take 1 tablet (20 mg total) by mouth daily. 90 tablet 3    No current facility-administered medications for this visit.             Physical Exam BP 103/63   Pulse 66   Ht 5\' 4"  (1.626 m)   Wt 170 lb (77.1 kg)   BMI 29.18 kg/m  Physical Exam  Constitutional: He is oriented to person, place, and time. He appears well-developed and well-nourished.  HENT:  Head: Normocephalic and atraumatic.  Eyes: Conjunctivae and EOM are normal. Pupils are equal, round, and reactive to light.  Neck: Normal range of motion and full passive range of motion without pain. Neck supple. No JVD present. No spinous process tenderness and no muscular tenderness present. No neck rigidity. No tracheal deviation and normal range of motion present. No thyromegaly present.    Cardiovascular: Normal rate, regular rhythm and intact distal pulses.  Pulmonary/Chest: Effort normal. No stridor.  Abdominal: Soft.  Lymphadenopathy:    He has no cervical adenopathy.  Neurological: He is alert and oriented to person,  place, and time. He has normal reflexes. No cranial nerve deficit. He exhibits normal muscle tone. Coordination normal.  Skin: Skin is warm and dry.  Psychiatric: He has a normal mood and affect. His behavior is normal. Judgment and thought content normal.    Right Shoulder Exam  Right shoulder exam is normal.   Tenderness  Right shoulder tenderness location: mild AC joint, rotator interval    Range of Motion  The patient has normal right shoulder ROM.   Muscle Strength  The patient has normal right shoulder strength.   Tests  Apprehension: negative Hawkins test: positive Cross arm: positive Impingement: negative Drop arm: negative Sulcus: absent   Other  Erythema: absent Sensation: normal Pulse: present   Comments:  Speed test + yerguson test - Obriens compression test +     Left Shoulder Exam  Left shoulder exam is normal.   Tenderness  The patient is experiencing no tenderness.    Range of Motion  The patient has normal left shoulder ROM.   Muscle Strength  The patient has normal left shoulder strength.   Tests  Apprehension: negative   Other  Erythema: absent Sensation: normal Pulse: present                Data Reviewed     Dr. Mort Sawyers own interpretation of the imaging Plain films 3 views right shoulder for right shoulder pain.  Acromion type I.  Glenohumeral joint normal mild sclerosis at the greater tuberosity.  Axillary view shows no erosion of the humeral head or the glenoid   MRI shows questionable SLAP lesion with mild acromial clavicular arthrosis     Copies of the reports are included X-RAYS :    FINDINGS: There is no evidence of fracture or dislocation. There is no evidence of arthropathy or other focal bone abnormality. Soft tissues are unremarkable.   IMPRESSION: Negative.     Electronically Signed   By: Jasmine Pang M.D.   On: 05/26/2017 15:15   MRI    Labrum: Linear increased T2 signal is seen within the  labrum from 11 o'clock to the 10 o'clock position superiorly which may be due to a tear. Otherwise unremarkable. IMPRESSION: Linear increased T2 signal within the posterior aspect of the superior labrum may be due to a small SLAP type 2 tear.   Intact and normal  appearing rotator cuff and long head of biceps.   Mild acromioclavicular osteoarthritis.     Electronically Signed   By: Drusilla Kannerhomas  Dalessio M.D.   On: 07/14/2017 17:24     Encounter Diagnoses  Name Primary?  . Cigarette nicotine dependence without complication    . Chronic right shoulder pain    . Medial epicondylitis of right elbow    . Pain in joint of right shoulder    . SLAP lesion, type II, nontraumatic, right, initial encounter Yes            Plan   After review of all the data and history and physical the patient has opted for surgery versus continued physical therapy.  We discussed that he may not have any pathology in the shoulder as the MRI is questionable whether the SLAP tear is present.  I have advised him he may have a biceps tenodesis versus an actual SLAP repair and we will address the acromio clavicular joint if needed     Surgical arthroscopy right shoulder with repair of SLAP lesion versus biceps tenodesis     Slap repair [id number]    [id number] biceps tenodesis   The procedure has been fully reviewed with the patient; The risks and benefits of surgery have been discussed and explained and understood. Alternative treatment has also been reviewed, questions were encouraged and answered. The postoperative plan is also been reviewed.

## 2017-08-02 NOTE — Telephone Encounter (Signed)
Called patient and left a voice message to please call us back and let me know which supplier he is using for his medtronic supplies. [facility] does not carry the supplies for his pump.

## 2017-08-02 NOTE — Telephone Encounter (Signed)
Called patient and gave him the new dosage while he is in surgery and he stated that he will adjust as prescribed.

## 2017-08-02 NOTE — Telephone Encounter (Signed)
Called patient and he stated that he needed insulin so I have sent his Humalog to the [facility] in [place].   Patient also stated that he is having surgery on shoulder tomorrow and he is not sure how to set his basal rates on his Medtronic insulin pump. He is not allowed to eat after midnight and he stated that sometimes he has lows in the morning and other times he has highs?  Please advise.

## 2017-08-02 NOTE — Telephone Encounter (Signed)
He needs to set his pump for a temporary basal of 25% below normal starting 6 AM until he is back home

## 2017-08-03 ENCOUNTER — Ambulatory Visit (HOSPITAL_COMMUNITY): Payer: PRIVATE HEALTH INSURANCE | Admitting: Anesthesiology

## 2017-08-03 ENCOUNTER — Encounter (HOSPITAL_COMMUNITY)
Admission: RE | Disposition: A | Discharge status: Home / Self Care | Payer: Self-pay | Source: Ambulatory Visit | Attending: Orthopedic Surgery

## 2017-08-03 ENCOUNTER — Encounter (HOSPITAL_COMMUNITY): Payer: Self-pay

## 2017-08-03 ENCOUNTER — Ambulatory Visit (HOSPITAL_COMMUNITY)
Admission: RE | Admit: 2017-08-03 | Discharge: 2017-08-03 | Disposition: A | Discharge status: Home / Self Care | Payer: PRIVATE HEALTH INSURANCE | Source: Ambulatory Visit | Attending: Orthopedic Surgery | Admitting: Orthopedic Surgery

## 2017-08-03 DIAGNOSIS — S46111D Strain of muscle, fascia and tendon of long head of biceps, right arm, subsequent encounter: Secondary | ICD-10-CM | POA: Diagnosis not present

## 2017-08-03 DIAGNOSIS — S43431A Superior glenoid labrum lesion of right shoulder, initial encounter: Secondary | ICD-10-CM | POA: Diagnosis not present

## 2017-08-03 DIAGNOSIS — S43431D Superior glenoid labrum lesion of right shoulder, subsequent encounter: Secondary | ICD-10-CM

## 2017-08-03 DIAGNOSIS — S46111A Strain of muscle, fascia and tendon of long head of biceps, right arm, initial encounter: Secondary | ICD-10-CM

## 2017-08-03 HISTORY — PX: SHOULDER ARTHROSCOPY: SHX128

## 2017-08-03 LAB — GLUCOSE, CAPILLARY
GLUCOSE-CAPILLARY: 252 mg/dL — AB (ref 65–99)
GLUCOSE-CAPILLARY: 246 mg/dL — AB (ref 65–99)
GLUCOSE-CAPILLARY: 322 mg/dL — AB (ref 65–99)
Glucose-Capillary: 273 mg/dL — ABNORMAL HIGH (ref 65–99)

## 2017-08-03 SURGERY — ARTHROSCOPY, SHOULDER
Anesthesia: General

## 2017-08-03 MED ORDER — BUPIVACAINE-EPINEPHRINE (PF) 0.5% -1:200000 IJ SOLN
INTRAMUSCULAR | Status: AC
  Filled 2017-08-03: qty 60

## 2017-08-03 MED ORDER — ROCURONIUM BROMIDE 50 MG/5ML IV SOLN
INTRAVENOUS | Status: AC
  Filled 2017-08-03: qty 1

## 2017-08-03 MED ORDER — PROPOFOL 10 MG/ML IV BOLUS
INTRAVENOUS | Status: AC
  Filled 2017-08-03: qty 20

## 2017-08-03 MED ORDER — SUGAMMADEX SODIUM 200 MG/2ML IV SOLN
INTRAVENOUS | Status: AC
  Filled 2017-08-03: qty 2

## 2017-08-03 MED ORDER — ONDANSETRON HCL 4 MG/2ML IJ SOLN
4.0000 mg | Freq: Once | INTRAMUSCULAR | Status: AC
  Administered 2017-08-03: 4 mg via INTRAVENOUS

## 2017-08-03 MED ORDER — SUGAMMADEX SODIUM 200 MG/2ML IV SOLN
INTRAVENOUS | Status: DC | PRN
  Administered 2017-08-03: 200 mg via INTRAVENOUS

## 2017-08-03 MED ORDER — INSULIN ASPART 100 UNIT/ML ~~LOC~~ SOLN
4.0000 [IU] | Freq: Once | SUBCUTANEOUS | Status: AC
  Administered 2017-08-03: 4 [IU] via SUBCUTANEOUS
  Filled 2017-08-03: qty 0.04

## 2017-08-03 MED ORDER — CEFAZOLIN SODIUM-DEXTROSE 2-4 GM/100ML-% IV SOLN
2.0000 g | INTRAVENOUS | Status: AC
  Administered 2017-08-03: 2 g via INTRAVENOUS
  Filled 2017-08-03: qty 100

## 2017-08-03 MED ORDER — ROCURONIUM BROMIDE 100 MG/10ML IV SOLN
INTRAVENOUS | Status: DC | PRN
  Administered 2017-08-03: 50 mg via INTRAVENOUS

## 2017-08-03 MED ORDER — SODIUM CHLORIDE 0.9 % IR SOLN
Status: DC | PRN
  Administered 2017-08-03: 3000 mL

## 2017-08-03 MED ORDER — MIDAZOLAM HCL 2 MG/2ML IJ SOLN
1.0000 mg | INTRAMUSCULAR | Status: AC
  Administered 2017-08-03: 1 mg via INTRAVENOUS

## 2017-08-03 MED ORDER — CHLORHEXIDINE GLUCONATE 4 % EX LIQD: 60.0000 mL | Freq: Once | CUTANEOUS | Status: DC

## 2017-08-03 MED ORDER — MIDAZOLAM HCL 2 MG/2ML IJ SOLN
INTRAMUSCULAR | Status: AC
  Filled 2017-08-03: qty 2

## 2017-08-03 MED ORDER — EPINEPHRINE PF 1 MG/ML IJ SOLN
INTRAMUSCULAR | Status: AC
  Filled 2017-08-03: qty 10

## 2017-08-03 MED ORDER — FENTANYL CITRATE (PF) 100 MCG/2ML IJ SOLN
INTRAMUSCULAR | Status: AC
  Filled 2017-08-03: qty 2

## 2017-08-03 MED ORDER — DIPHENHYDRAMINE HCL 25 MG PO TABS: 25.0000 mg | ORAL_TABLET | ORAL | 0 refills | Status: DC | PRN

## 2017-08-03 MED ORDER — LACTATED RINGERS IV SOLN
INTRAVENOUS | Status: DC
  Administered 2017-08-03: 11:00:00 via INTRAVENOUS

## 2017-08-03 MED ORDER — PROPOFOL 10 MG/ML IV BOLUS
INTRAVENOUS | Status: DC | PRN
  Administered 2017-08-03: 200 mg via INTRAVENOUS

## 2017-08-03 MED ORDER — SODIUM CHLORIDE 0.9 % IR SOLN
Status: DC | PRN
  Administered 2017-08-03: 1

## 2017-08-03 MED ORDER — BUPIVACAINE-EPINEPHRINE 0.5% -1:200000 IJ SOLN
INTRAMUSCULAR | Status: DC | PRN
  Administered 2017-08-03: 60 mL

## 2017-08-03 MED ORDER — PHENYLEPHRINE HCL 10 MG/ML IJ SOLN
INTRAMUSCULAR | Status: DC | PRN
  Administered 2017-08-03 (×3): 80 ug via INTRAVENOUS

## 2017-08-03 MED ORDER — FENTANYL CITRATE (PF) 100 MCG/2ML IJ SOLN
INTRAMUSCULAR | Status: DC | PRN
  Administered 2017-08-03: 50 ug via INTRAVENOUS

## 2017-08-03 MED ORDER — EPHEDRINE SULFATE 50 MG/ML IJ SOLN
INTRAMUSCULAR | Status: DC | PRN
  Administered 2017-08-03: 10 mg via INTRAVENOUS

## 2017-08-03 MED ORDER — HYDROCODONE-ACETAMINOPHEN 5-325 MG PO TABS: 1.0000 | ORAL_TABLET | ORAL | 0 refills | Status: AC | PRN

## 2017-08-03 MED ORDER — ONDANSETRON HCL 4 MG/2ML IJ SOLN
INTRAMUSCULAR | Status: AC
  Filled 2017-08-03: qty 2

## 2017-08-03 MED ORDER — HYDROMORPHONE HCL 1 MG/ML IJ SOLN: 0.2500 mg | INTRAMUSCULAR | Status: DC | PRN

## 2017-08-03 MED ORDER — GLYCOPYRROLATE 0.2 MG/ML IJ SOLN
INTRAMUSCULAR | Status: DC | PRN
  Administered 2017-08-03: 0.2 mg via INTRAVENOUS

## 2017-08-03 MED ORDER — ONDANSETRON HCL 4 MG PO TABS: 4.0000 mg | ORAL_TABLET | Freq: Three times a day (TID) | ORAL | 0 refills | Status: DC | PRN

## 2017-08-03 MED ORDER — PHENYLEPHRINE 40 MCG/ML (10ML) SYRINGE FOR IV PUSH (FOR BLOOD PRESSURE SUPPORT)
PREFILLED_SYRINGE | INTRAVENOUS | Status: AC
  Filled 2017-08-03: qty 10

## 2017-08-03 SURGICAL SUPPLY — 49 items
BAG HAMPER (MISCELLANEOUS) ×3 IMPLANT
BLADE 11 SAFETY STRL DISP (BLADE) ×3 IMPLANT
BLADE AGGRESSIVE PLUS 4.0 (BLADE) ×3 IMPLANT
CANNULA DRILOCK 6.5X75 (CANNULA) ×2 IMPLANT
CHLORAPREP W/TINT 26ML (MISCELLANEOUS) ×3 IMPLANT
CLOTH BEACON ORANGE TIMEOUT ST (SAFETY) ×3 IMPLANT
COVER LIGHT HANDLE STERIS (MISCELLANEOUS) ×8 IMPLANT
DECANTER SPIKE VIAL GLASS SM (MISCELLANEOUS) ×5 IMPLANT
DRAPE PROXIMA HALF (DRAPES) ×3 IMPLANT
DRAPE SHOULDER BEACH CHAIR (DRAPES) ×3 IMPLANT
DRAPE U-SHAPE 47X51 STRL (DRAPES) ×3 IMPLANT
DRESSING MEPILEX BORDER 6X8 (GAUZE/BANDAGES/DRESSINGS) ×2 IMPLANT
DRSG MEPILEX BORDER 6X8 (GAUZE/BANDAGES/DRESSINGS) ×3
ELECT REM PT RETURN 9FT ADLT (ELECTROSURGICAL) ×3
ELECTRODE REM PT RTRN 9FT ADLT (ELECTROSURGICAL) ×2 IMPLANT
GLOVE BIOGEL M 7.0 STRL (GLOVE) ×4 IMPLANT
GLOVE BIOGEL PI IND STRL 7.0 (GLOVE) ×3 IMPLANT
GLOVE BIOGEL PI INDICATOR 7.0 (GLOVE) ×2
GLOVE SKINSENSE NS SZ8.0 LF (GLOVE) ×1
GLOVE SKINSENSE STRL SZ8.0 LF (GLOVE) ×2 IMPLANT
GLOVE SS N UNI LF 8.5 STRL (GLOVE) ×3 IMPLANT
GOWN STRL REUS W/TWL LRG LVL3 (GOWN DISPOSABLE) ×8 IMPLANT
GOWN STRL REUS W/TWL XL LVL3 (GOWN DISPOSABLE) ×3 IMPLANT
INST SET MINOR BONE (KITS) ×3 IMPLANT
IV NS IRRIG 3000ML ARTHROMATIC (IV SOLUTION) ×6 IMPLANT
KIT BLADEGUARD II DBL (SET/KITS/TRAYS/PACK) ×3 IMPLANT
KIT POSITION SHOULDER SCHLEI (MISCELLANEOUS) ×3 IMPLANT
KIT ROOM TURNOVER APOR (KITS) ×3 IMPLANT
MANIFOLD NEPTUNE II (INSTRUMENTS) ×3 IMPLANT
MARKER SKIN DUAL TIP RULER LAB (MISCELLANEOUS) ×3 IMPLANT
NDL HYPO 21X1.5 SAFETY (NEEDLE) ×1 IMPLANT
NDL SPNL 18GX3.5 QUINCKE PK (NEEDLE) ×1 IMPLANT
NEEDLE HYPO 21X1.5 SAFETY (NEEDLE) ×3 IMPLANT
NEEDLE SPNL 18GX3.5 QUINCKE PK (NEEDLE) ×3 IMPLANT
NS IRRIG 1000ML POUR BTL (IV SOLUTION) ×3 IMPLANT
PACK BASIC III (CUSTOM PROCEDURE TRAY) ×3
PACK SRG BSC III STRL LF ECLPS (CUSTOM PROCEDURE TRAY) ×2 IMPLANT
PAD ARMBOARD 7.5X6 YLW CONV (MISCELLANEOUS) ×3 IMPLANT
PENCIL HANDSWITCHING (ELECTRODE) ×3 IMPLANT
SET ARTHROSCOPY INST (INSTRUMENTS) ×3 IMPLANT
SET BASIN LINEN APH (SET/KITS/TRAYS/PACK) ×3 IMPLANT
SLING ARM FOAM STRAP LRG (SOFTGOODS) ×2 IMPLANT
STOCKINETTE IMPERVIOUS LG (DRAPES) ×3 IMPLANT
SUT ETHILON 3 0 FSL (SUTURE) ×2 IMPLANT
SYR 30ML LL (SYRINGE) ×3 IMPLANT
SYR BULB IRRIGATION 50ML (SYRINGE) ×2 IMPLANT
TUBING ARTHROSCOPY INFLOW/OUT (IRRIGATION / IRRIGATOR) ×3 IMPLANT
YANKAUER SUCT 12FT TUBE ARGYLE (SUCTIONS) ×2 IMPLANT
YANKAUER SUCT BULB TIP 10FT TU (MISCELLANEOUS) ×6 IMPLANT

## 2017-08-03 NOTE — Anesthesia Procedure Notes (Signed)
Procedure Name: Intubation Date/Time: 08/03/2017 12:19 PM Performed by: Shanon PayorGregory, Antonio Creswell M, CRNA Pre-anesthesia Checklist: Patient identified, Emergency Drugs available, Suction available, Patient being monitored and Timeout performed Patient Re-evaluated:Patient Re-evaluated prior to induction Oxygen Delivery Method: Circle system utilized Preoxygenation: Pre-oxygenation with 100% oxygen Induction Type: IV induction Grade View: Grade I Tube type: Oral Tube size: 7.0 mm Number of attempts: 1 Airway Equipment and Method: Stylet Secured at: 22 cm Tube secured with: Tape Dental Injury: Teeth and Oropharynx as per pre-operative assessment

## 2017-08-03 NOTE — Interval H&P Note (Signed)
History and Physical Interval Note:  08/03/2017 10:33 AM   There were no vitals taken for this visit.  BMP Latest Ref Rng & Units 07/31/2017 06/27/2017 11/08/2016  Glucose 65 - 99 mg/dL 161(W129(H) 99 960(A144(H)  BUN 6 - 20 mg/dL 15 13 17   Creatinine 0.61 - 1.24 mg/dL 5.401.03 9.811.07 1.911.07  Sodium 135 - 145 mmol/L 140 137 136  Potassium 3.5 - 5.1 mmol/L 3.8 4.0 4.5  Chloride 101 - 111 mmol/L 103 102 104  CO2 22 - 32 mmol/L 29 30 30   Calcium 8.9 - 10.3 mg/dL 9.3 47.810.1 9.7   CBC Latest Ref Rng & Units 07/31/2017 08/31/2014 03/27/2014  WBC 4.0 - 10.5 K/uL 8.9 9.0 9.3  Hemoglobin 13.0 - 17.0 g/dL 29.514.6 62.115.5 30.815.5  Hematocrit 39.0 - 52.0 % 43.1 42.9 42.8  Platelets 150 - 400 K/uL 342 330 315   IMPRESSION: Linear increased T2 signal within the posterior aspect of the superior labrum may be due to a small SLAP type 2 tear.   Intact and normal appearing rotator cuff and long head of biceps.   Mild acromioclavicular osteoarthritis.     Electronically Signed   By: Drusilla Kannerhomas  Dalessio M.D.   On: 07/14/2017 17:24    Alex Tucker  has presented today for surgery, with the diagnosis of slap tear right shoulder  The various methods of treatment have been discussed with the patient and family. After consideration of risks, benefits and other options for treatment, the patient has consented to  Procedure(s): SHOULDER ARTHROSCOPY WITH BICEPS TENDON REPAIR/tenodesis (Right) as a surgical intervention .  The patient's history has been reviewed, patient examined, no change in status, stable for surgery.  I have reviewed the patient's chart and labs.  Questions were answered to the patient's satisfaction.     Fuller CanadaStanley Harrison

## 2017-08-03 NOTE — Brief Op Note (Signed)
08/03/2017  1:34 PM  PATIENT:  Alex Tucker  46 y.o. male  PRE-OPERATIVE DIAGNOSIS:  slap tear right shoulder  POST-OPERATIVE DIAGNOSIS:  Bankhart and Slap Tear Right Shoulder  PROCEDURE:  Procedure(s): RIGHT SHOULDER  DIAGNOSTIC ARTHROSCOPY   Findings under anesthesia the patient had mild instability anteriorly and posteriorly with a clunk in abduction external rotation as well as adduction internal rotation  Nonvisualization of the joint the SLAP tear extended from posterior to anterior including the anterior glenoid.  There was an abrasion of the undersurface of the rotator cuff but it was intact  Details of procedure   The patient was identified in the preop area right shoulder marked for surgery  He was taken to the operating room for general anesthesia and exam under anesthesia  In the exam under anesthesia portion in abduction external rotation the patient appeared to sublux anteriorly and then there was a clunk as I relocated him.  In the adductor position he appeared to have some posterior instability with a clunk as well.  After sterile prep and drape and timeout a posterior portal was established the scope was placed in the joint easily.  Upon entering the joint it was noted that the labrum was torn from posterior to anterior from approximately 10:00 all the way down the glenoid to approximately 5:00  There was a mild abrasion of the undersurface of the rotator cuff which was debrided through an anterior portal.  The subscapularis had some superior fraying but was otherwise intact.  The actual biceps tendon itself was normal  After irrigating the joint I closed the portals.  The nature of the repair is above my technical skill so we placed him in a sling extubated and taken to the recovery room and made arrangements for him to see a shoulder specialist who I have spoken to Dr. Dean  SURGEON:  Surgeon(s) and Role:    * Zaleigh Bermingham E, MD - Primary  PHYSICIAN  ASSISTANT:   ASSISTANTS: Betty Ashley  ANESTHESIA:   general  EBL:  50 mL   BLOOD ADMINISTERED:none  DRAINS: none   LOCAL MEDICATIONS USED:  MARCAINE     SPECIMEN:  No Specimen  DISPOSITION OF SPECIMEN:  N/A  COUNTS:  YES  TOURNIQUET:  * No tourniquets in log *  DICTATION: .Dragon Dictation  PLAN OF CARE: Discharge to home after PACU  PATIENT DISPOSITION:  PACU - hemodynamically stable.   Delay start of Pharmacological VTE agent (>24hrs) due to surgical blood loss or risk of bleeding: not applicable  

## 2017-08-03 NOTE — Discharge Instructions (Signed)
Shoulder Arthroscopy, Care After Refer to this sheet in the next few weeks. These instructions provide you with information on caring for yourself after your procedure. Your health care provider may also give you more specific instructions. Your treatment has been planned according to current medical practices, but problems sometimes occur. Call your health care provider if you have any problems or questions after your procedure. What can I expect after the procedure? After your procedure, it is typical to have the following:  Pain at the site of the surgical cuts (incisions).  Stiffness in your shoulder. This should gradually decrease over time. Your health care provider may recommend physical therapy to help improve this.  Nausea, vomiting, or constipation. These symptoms can result from taking pain medicine after surgery.  Clear or red drainage from the incision sites. This is normal for a few days after surgery.  Fatigue.  Follow these instructions at home:  Take medicines only as directed by your health care provider.  Use a sling as directed.  There are many different ways to close and cover an incision, including stitches, skin glue, and adhesive strips. Follow your health care provider's instructions on: ? Incision care. ? Bandage (dressing) changes and removal. ? Incision closure removal.  Apply ice to the injured area: ? Put ice in a plastic bag. ? Place a towel between your skin and the bag. ? Leave the ice on for 20 minutes, 2-3 times a day.  If physical therapy and exercises are prescribed by your health care provider, do them as directed.  Keep all follow-up visits as directed by your health care provider. This is important. Contact a health care provider if: You have a fever. Get help right away if:  You have drainage, redness, swelling, or increasing pain at the incision site.  You notice a bad smell coming from the incision site or dressing.  Your incision  site breaks open after your stitches or tape has been removed. This information is not intended to replace advice given to you by your health care provider. Make sure you discuss any questions you have with your health care provider. Document Released: 03/05/2014 Document Revised: 04/03/2016 Document Reviewed: 09/27/2013 Elsevier Interactive Patient Education  2017 [employer]

## 2017-08-03 NOTE — Anesthesia Preprocedure Evaluation (Signed)
Anesthesia Evaluation  Patient identified by MRN, date of birth, ID band Patient awake    Reviewed: Allergy & Precautions, NPO status , Patient's Chart, lab work & pertinent test results  Airway Mallampati: II  TM Distance: >3 FB Neck ROM: Full    Dental  (+) Teeth Intact   Pulmonary Current Smoker,    breath sounds clear to auscultation       Cardiovascular negative cardio ROS   Rhythm:Regular Rate:Normal     Neuro/Psych Seizures -, Well Controlled,  Hx traumatic brain injury.    GI/Hepatic GERD  Medicated and Controlled,  Endo/Other  diabetes, Type 2, Insulin Dependent  Renal/GU      Musculoskeletal   Abdominal   Peds  Hematology   Anesthesia Other Findings   Reproductive/Obstetrics                             Anesthesia Physical Anesthesia Plan  ASA: III  Anesthesia Plan: General   Post-op Pain Management:    Induction: Intravenous  PONV Risk Score and Plan:   Airway Management Planned: Oral ETT  Additional Equipment:   Intra-op Plan:   Post-operative Plan: Extubation in OR  Informed Consent: I have reviewed the patients History and Physical, chart, labs and discussed the procedure including the risks, benefits and alternatives for the proposed anesthesia with the patient or authorized representative who has indicated his/her understanding and acceptance.     Plan Discussed with: CRNA and Surgeon  Anesthesia Plan Comments: (CBG=275,  novolog 4u SQ pre-op)        Anesthesia Quick Evaluation

## 2017-08-03 NOTE — Transfer of Care (Signed)
Immediate Anesthesia Transfer of Care Note  Patient: Alex GrebeBrian M Mullin  Procedure(s) Performed: RIGHT SHOULDER  DIAGNOSTIC ARTHROSCOPY  Patient Location: PACU  Anesthesia Type:General  Level of Consciousness: awake, alert  and oriented  Airway & Oxygen Therapy: Patient Spontanous Breathing and Patient connected to nasal cannula oxygen  Post-op Assessment: Report given to RN and Post -op Vital signs reviewed and stable  Post vital signs: Reviewed and stable  Last Vitals:  Vitals:   08/03/17 1142 08/03/17 1150  BP: (!) 125/54 (!) 110/54  Pulse: 62   Resp:  13  Temp: 36.5 C   SpO2: 96% 94%    Last Pain:  Vitals:   08/03/17 1142  TempSrc: Axillary         Complications: No apparent anesthesia complications

## 2017-08-03 NOTE — Op Note (Signed)
08/03/2017  1:34 PM  PATIENT:  Dorene GrebeBrian M Trostel  46 y.o. male  PRE-OPERATIVE DIAGNOSIS:  slap tear right shoulder  POST-OPERATIVE DIAGNOSIS:  Bankhart and Slap Tear Right Shoulder  PROCEDURE:  Procedure(s): RIGHT SHOULDER  DIAGNOSTIC ARTHROSCOPY   Findings under anesthesia the patient had mild instability anteriorly and posteriorly with a clunk in abduction external rotation as well as adduction internal rotation  Nonvisualization of the joint the SLAP tear extended from posterior to anterior including the anterior glenoid.  There was an abrasion of the undersurface of the rotator cuff but it was intact  Details of procedure   The patient was identified in the preop area right shoulder marked for surgery  He was taken to the operating room for general anesthesia and exam under anesthesia  In the exam under anesthesia portion in abduction external rotation the patient appeared to sublux anteriorly and then there was a clunk as I relocated him.  In the adductor position he appeared to have some posterior instability with a clunk as well.  After sterile prep and drape and timeout a posterior portal was established the scope was placed in the joint easily.  Upon entering the joint it was noted that the labrum was torn from posterior to anterior from approximately 10:00 all the way down the glenoid to approximately 5:00  There was a mild abrasion of the undersurface of the rotator cuff which was debrided through an anterior portal.  The subscapularis had some superior fraying but was otherwise intact.  The actual biceps tendon itself was normal  After irrigating the joint I closed the portals.  The nature of the repair is above my technical skill so we placed him in a sling extubated and taken to the recovery room and made arrangements for him to see a shoulder specialist who I have spoken to Dr. August Saucerean  SURGEON:  Surgeon(s) and Role:    * Vickki HearingHarrison, Cristo Ausburn E, MD - Primary  PHYSICIAN  ASSISTANT:   ASSISTANTS: Sumpter NationBetty Ashley  ANESTHESIA:   general  EBL:  50 mL   BLOOD ADMINISTERED:none  DRAINS: none   LOCAL MEDICATIONS USED:  MARCAINE     SPECIMEN:  No Specimen  DISPOSITION OF SPECIMEN:  N/A  COUNTS:  YES  TOURNIQUET:  * No tourniquets in log *  DICTATION: .Dragon Dictation  PLAN OF CARE: Discharge to home after PACU  PATIENT DISPOSITION:  PACU - hemodynamically stable.   Delay start of Pharmacological VTE agent (>24hrs) due to surgical blood loss or risk of bleeding: not applicable

## 2017-08-04 ENCOUNTER — Telehealth: Payer: Self-pay | Admitting: Radiology

## 2017-08-04 ENCOUNTER — Encounter (HOSPITAL_COMMUNITY): Payer: Self-pay | Admitting: Orthopedic Surgery

## 2017-08-04 ENCOUNTER — Ambulatory Visit (INDEPENDENT_AMBULATORY_CARE_PROVIDER_SITE_OTHER): Payer: Medicare Other | Admitting: Orthopedic Surgery

## 2017-08-04 DIAGNOSIS — S43431A Superior glenoid labrum lesion of right shoulder, initial encounter: Secondary | ICD-10-CM

## 2017-08-04 DIAGNOSIS — S43431D Superior glenoid labrum lesion of right shoulder, subsequent encounter: Secondary | ICD-10-CM

## 2017-08-04 NOTE — Telephone Encounter (Signed)
Dr Romeo AppleHarrison spoke to Dr August Saucerean about this referral I have put in Epic, let me know if you need anything else.

## 2017-08-04 NOTE — Anesthesia Postprocedure Evaluation (Addendum)
Anesthesia Post Note  Patient: Alex Tucker  Procedure(s) Performed: RIGHT SHOULDER  DIAGNOSTIC ARTHROSCOPY  Patient location during evaluation: PACU Anesthesia Type: General Level of consciousness: awake and alert Pain management: satisfactory to patient Vital Signs Assessment: post-procedure vital signs reviewed and stable Respiratory status: spontaneous breathing Cardiovascular status: stable Postop Assessment: no apparent nausea or vomiting Anesthetic complications: no     Last Vitals:  Vitals:   08/03/17 1400 08/03/17 1427  BP: 127/71 (!) 107/58  Pulse: 82 81  Resp: 13 18  Temp:  36.7 C  SpO2: 100% 96%    Last Pain:  Vitals:   08/03/17 1427  TempSrc: Oral         late entry. Post op note performed 121/13/2018 1415        Allanna Bresee

## 2017-08-04 NOTE — Telephone Encounter (Signed)
Thanks so much. 

## 2017-08-04 NOTE — Telephone Encounter (Signed)
Thank you! He is supposed to be coming in this morning. I talked to patient yesterday.  Does patient have his scope pictures?

## 2017-08-04 NOTE — Telephone Encounter (Signed)
Tell Dr August Saucerean we appreciate him seeing the patient.

## 2017-08-04 NOTE — Telephone Encounter (Signed)
Yes, he has the pictures and the disk, it is on both.
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
## 2017-08-10 DIAGNOSIS — Z9889 Other specified postprocedural states: Secondary | ICD-10-CM | POA: Insufficient documentation

## 2017-08-11 ENCOUNTER — Ambulatory Visit (INDEPENDENT_AMBULATORY_CARE_PROVIDER_SITE_OTHER): Payer: Medicare Other | Admitting: Orthopedic Surgery

## 2017-08-11 VITALS — BP 107/72 | HR 71 | Ht 64.0 in | Wt 170.0 lb

## 2017-08-11 DIAGNOSIS — Z9889 Other specified postprocedural states: Secondary | ICD-10-CM

## 2017-08-11 DIAGNOSIS — S43431D Superior glenoid labrum lesion of right shoulder, subsequent encounter: Secondary | ICD-10-CM

## 2017-08-11 NOTE — Progress Notes (Signed)
Postop visit #1 status post arthroscopy right shoulder patient had diagnostic arthroscopy with global labral tear which I did not repair.  I referred him to Dr. August Saucerean who was seen him and recommended surgery.  He can do Codman exercises and shoulder flexion to 90 degrees he is already out of work for 8 weeks he will follow-up with Dr. August Saucerean unless he has trouble before that time which she will call us for recommendations

## 2017-08-24 ENCOUNTER — Other Ambulatory Visit (INDEPENDENT_AMBULATORY_CARE_PROVIDER_SITE_OTHER): Payer: Self-pay | Admitting: Orthopedic Surgery

## 2017-08-24 DIAGNOSIS — S43431A Superior glenoid labrum lesion of right shoulder, initial encounter: Secondary | ICD-10-CM

## 2017-08-28 ENCOUNTER — Telehealth: Payer: Self-pay | Admitting: Endocrinology

## 2017-08-28 ENCOUNTER — Encounter (HOSPITAL_COMMUNITY): Payer: Self-pay | Admitting: *Deleted

## 2017-08-28 ENCOUNTER — Other Ambulatory Visit: Payer: Self-pay

## 2017-08-28 NOTE — Progress Notes (Signed)
Mr Alex Tucker had Type II diabetes per Dr Lucianne MussKumar.  Patient has an Insulin Pump and states he is going to call Dr Lucianne MussKumar today for instructions on managing pump pre- op, patient asking if he is NPO at midnight.  I spoke with Dr. Renold DonGermeroth and he gave orders for no solid food after midnight , may have clear liquids until 8:00 AM.  Patient informed.  Patient states that if CBG id less than 70 that he will suspend pump and drink 1/2 cup of clear liquids as I instructed.  (patient states that he does not use Glucogen).

## 2017-08-29 ENCOUNTER — Encounter (HOSPITAL_COMMUNITY): Payer: Self-pay | Admitting: Surgery

## 2017-08-29 ENCOUNTER — Encounter (INDEPENDENT_AMBULATORY_CARE_PROVIDER_SITE_OTHER): Payer: Self-pay | Admitting: Orthopedic Surgery

## 2017-08-29 ENCOUNTER — Ambulatory Visit (HOSPITAL_COMMUNITY): Payer: Managed Care, Other (non HMO) | Admitting: Certified Registered Nurse Anesthetist

## 2017-08-29 ENCOUNTER — Encounter (HOSPITAL_COMMUNITY)
Admission: RE | Disposition: A | Discharge status: Home / Self Care | Payer: Self-pay | Source: Ambulatory Visit | Attending: Orthopedic Surgery

## 2017-08-29 ENCOUNTER — Ambulatory Visit (HOSPITAL_COMMUNITY)
Admission: RE | Admit: 2017-08-29 | Discharge: 2017-08-29 | Disposition: A | Discharge status: Home / Self Care | Payer: Managed Care, Other (non HMO) | Source: Ambulatory Visit | Attending: Orthopedic Surgery | Admitting: Orthopedic Surgery

## 2017-08-29 DIAGNOSIS — Z87820 Personal history of traumatic brain injury: Secondary | ICD-10-CM | POA: Diagnosis not present

## 2017-08-29 DIAGNOSIS — Z79899 Other long term (current) drug therapy: Secondary | ICD-10-CM | POA: Diagnosis not present

## 2017-08-29 DIAGNOSIS — K219 Gastro-esophageal reflux disease without esophagitis: Secondary | ICD-10-CM | POA: Diagnosis not present

## 2017-08-29 DIAGNOSIS — M19019 Primary osteoarthritis, unspecified shoulder: Secondary | ICD-10-CM | POA: Diagnosis not present

## 2017-08-29 DIAGNOSIS — Z87442 Personal history of urinary calculi: Secondary | ICD-10-CM | POA: Insufficient documentation

## 2017-08-29 DIAGNOSIS — M109 Gout, unspecified: Secondary | ICD-10-CM | POA: Diagnosis not present

## 2017-08-29 DIAGNOSIS — K589 Irritable bowel syndrome without diarrhea: Secondary | ICD-10-CM | POA: Insufficient documentation

## 2017-08-29 DIAGNOSIS — Z885 Allergy status to narcotic agent status: Secondary | ICD-10-CM | POA: Diagnosis not present

## 2017-08-29 DIAGNOSIS — F17210 Nicotine dependence, cigarettes, uncomplicated: Secondary | ICD-10-CM | POA: Insufficient documentation

## 2017-08-29 DIAGNOSIS — E119 Type 2 diabetes mellitus without complications: Secondary | ICD-10-CM | POA: Diagnosis not present

## 2017-08-29 DIAGNOSIS — Z794 Long term (current) use of insulin: Secondary | ICD-10-CM | POA: Diagnosis not present

## 2017-08-29 DIAGNOSIS — S43431A Superior glenoid labrum lesion of right shoulder, initial encounter: Secondary | ICD-10-CM | POA: Insufficient documentation

## 2017-08-29 DIAGNOSIS — E7800 Pure hypercholesterolemia, unspecified: Secondary | ICD-10-CM | POA: Diagnosis not present

## 2017-08-29 DIAGNOSIS — M170 Bilateral primary osteoarthritis of knee: Secondary | ICD-10-CM | POA: Diagnosis not present

## 2017-08-29 DIAGNOSIS — X58XXXA Exposure to other specified factors, initial encounter: Secondary | ICD-10-CM | POA: Insufficient documentation

## 2017-08-29 HISTORY — PX: SHOULDER ARTHROSCOPY WITH LABRAL REPAIR: SHX5691

## 2017-08-29 HISTORY — DX: Irritable bowel syndrome, unspecified: K58.9

## 2017-08-29 LAB — CBC
HEMATOCRIT: 42.1 % (ref 39.0–52.0)
HEMOGLOBIN: 14.5 g/dL (ref 13.0–17.0)
MCH: 30.3 pg (ref 26.0–34.0)
MCHC: 34.4 g/dL (ref 30.0–36.0)
MCV: 88.1 fL (ref 78.0–100.0)
Platelets: 331 10*3/uL (ref 150–400)
RBC: 4.78 MIL/uL (ref 4.22–5.81)
RDW: 12.5 % (ref 11.5–15.5)
WBC: 7.8 10*3/uL (ref 4.0–10.5)

## 2017-08-29 LAB — HEMOGLOBIN A1C
HEMOGLOBIN A1C: 8 % — AB (ref 4.8–5.6)
MEAN PLASMA GLUCOSE: 182.9 mg/dL

## 2017-08-29 LAB — GLUCOSE, CAPILLARY
GLUCOSE-CAPILLARY: 239 mg/dL — AB (ref 65–99)
GLUCOSE-CAPILLARY: 231 mg/dL — AB (ref 65–99)
Glucose-Capillary: 126 mg/dL — ABNORMAL HIGH (ref 65–99)
Glucose-Capillary: 186 mg/dL — ABNORMAL HIGH (ref 65–99)
Glucose-Capillary: 341 mg/dL — ABNORMAL HIGH (ref 65–99)
Glucose-Capillary: 219 mg/dL — ABNORMAL HIGH (ref 65–99)

## 2017-08-29 LAB — BASIC METABOLIC PANEL
Anion gap: 10 (ref 5–15)
BUN: 15 mg/dL (ref 6–20)
CHLORIDE: 103 mmol/L (ref 101–111)
CO2: 23 mmol/L (ref 22–32)
Calcium: 9.2 mg/dL (ref 8.9–10.3)
Creatinine, Ser: 0.95 mg/dL (ref 0.61–1.24)
GFR calc Af Amer: >60 mL/min (ref >60)
GFR calc non Af Amer: >60 mL/min (ref >60)
Glucose, Bld: 123 mg/dL — ABNORMAL HIGH (ref 65–99)
POTASSIUM: 3.9 mmol/L (ref 3.5–5.1)
SODIUM: 136 mmol/L (ref 135–145)

## 2017-08-29 SURGERY — ARTHROSCOPY, SHOULDER, WITH GLENOID LABRUM REPAIR
Anesthesia: General | Site: Shoulder | Laterality: Right

## 2017-08-29 MED ORDER — HYDROCODONE-ACETAMINOPHEN 5-325 MG PO TABS: 1.0000 | ORAL_TABLET | Freq: Four times a day (QID) | ORAL | 0 refills | Status: DC | PRN

## 2017-08-29 MED ORDER — PROPOFOL 10 MG/ML IV BOLUS
INTRAVENOUS | Status: AC
  Filled 2017-08-29: qty 20

## 2017-08-29 MED ORDER — CEFAZOLIN SODIUM-DEXTROSE 2-4 GM/100ML-% IV SOLN
2.0000 g | INTRAVENOUS | Status: AC
  Administered 2017-08-29: 2 g via INTRAVENOUS

## 2017-08-29 MED ORDER — LACTATED RINGERS IV SOLN
INTRAVENOUS | Status: DC
  Administered 2017-08-29 (×2): via INTRAVENOUS

## 2017-08-29 MED ORDER — INSULIN ASPART 100 UNIT/ML ~~LOC~~ SOLN
SUBCUTANEOUS | Status: AC
  Filled 2017-08-29: qty 1

## 2017-08-29 MED ORDER — MIDAZOLAM HCL 2 MG/2ML IJ SOLN
INTRAMUSCULAR | Status: AC
  Administered 2017-08-29: 2 mg via INTRAVENOUS
  Filled 2017-08-29: qty 2

## 2017-08-29 MED ORDER — PROPOFOL 10 MG/ML IV BOLUS
INTRAVENOUS | Status: DC | PRN
  Administered 2017-08-29: 200 mg via INTRAVENOUS

## 2017-08-29 MED ORDER — FENTANYL CITRATE (PF) 250 MCG/5ML IJ SOLN
INTRAMUSCULAR | Status: AC
  Filled 2017-08-29: qty 5

## 2017-08-29 MED ORDER — ROCURONIUM BROMIDE 100 MG/10ML IV SOLN
INTRAVENOUS | Status: DC | PRN
  Administered 2017-08-29: 20 mg via INTRAVENOUS
  Administered 2017-08-29: 50 mg via INTRAVENOUS
  Administered 2017-08-29 (×2): 20 mg via INTRAVENOUS

## 2017-08-29 MED ORDER — SODIUM CHLORIDE 0.9 % IR SOLN
Status: DC | PRN
  Administered 2017-08-29 (×2): 6000 mL

## 2017-08-29 MED ORDER — BUPIVACAINE-EPINEPHRINE (PF) 0.5% -1:200000 IJ SOLN
INTRAMUSCULAR | Status: DC | PRN
  Administered 2017-08-29: 30 mL via PERINEURAL

## 2017-08-29 MED ORDER — ONDANSETRON HCL 4 MG/2ML IJ SOLN: 4.0000 mg | Freq: Four times a day (QID) | INTRAMUSCULAR | Status: DC | PRN

## 2017-08-29 MED ORDER — MIDAZOLAM HCL 5 MG/5ML IJ SOLN
INTRAMUSCULAR | Status: DC | PRN
  Administered 2017-08-29 (×2): 1 mg via INTRAVENOUS

## 2017-08-29 MED ORDER — FENTANYL CITRATE (PF) 100 MCG/2ML IJ SOLN
INTRAMUSCULAR | Status: AC
  Administered 2017-08-29: 100 ug via INTRAVENOUS
  Filled 2017-08-29: qty 2

## 2017-08-29 MED ORDER — DEXAMETHASONE SODIUM PHOSPHATE 10 MG/ML IJ SOLN
INTRAMUSCULAR | Status: AC
  Filled 2017-08-29: qty 1

## 2017-08-29 MED ORDER — ONDANSETRON HCL 4 MG/2ML IJ SOLN
INTRAMUSCULAR | Status: DC | PRN
  Administered 2017-08-29: 4 mg via INTRAVENOUS

## 2017-08-29 MED ORDER — DEXAMETHASONE SODIUM PHOSPHATE 10 MG/ML IJ SOLN
INTRAMUSCULAR | Status: DC | PRN
  Administered 2017-08-29: 4 mg via INTRAVENOUS

## 2017-08-29 MED ORDER — ROCURONIUM BROMIDE 10 MG/ML (PF) SYRINGE
PREFILLED_SYRINGE | INTRAVENOUS | Status: AC
  Filled 2017-08-29: qty 5

## 2017-08-29 MED ORDER — FENTANYL CITRATE (PF) 100 MCG/2ML IJ SOLN: 25.0000 ug | INTRAMUSCULAR | Status: DC | PRN

## 2017-08-29 MED ORDER — CHLORHEXIDINE GLUCONATE 4 % EX LIQD: 60.0000 mL | Freq: Once | CUTANEOUS | Status: DC

## 2017-08-29 MED ORDER — CEFAZOLIN SODIUM-DEXTROSE 2-4 GM/100ML-% IV SOLN
INTRAVENOUS | Status: AC
  Filled 2017-08-29: qty 100

## 2017-08-29 MED ORDER — MIDAZOLAM HCL 2 MG/2ML IJ SOLN
2.0000 mg | Freq: Once | INTRAMUSCULAR | Status: AC
  Administered 2017-08-29: 2 mg via INTRAVENOUS
  Filled 2017-08-29: qty 2

## 2017-08-29 MED ORDER — LIDOCAINE 2% (20 MG/ML) 5 ML SYRINGE
INTRAMUSCULAR | Status: AC
  Filled 2017-08-29: qty 5

## 2017-08-29 MED ORDER — FENTANYL CITRATE (PF) 100 MCG/2ML IJ SOLN
INTRAMUSCULAR | Status: DC | PRN
  Administered 2017-08-29 (×2): 50 ug via INTRAVENOUS

## 2017-08-29 MED ORDER — OXYCODONE HCL 5 MG PO TABS: 5.0000 mg | ORAL_TABLET | Freq: Once | ORAL | Status: DC | PRN

## 2017-08-29 MED ORDER — PHENYLEPHRINE HCL 10 MG/ML IJ SOLN
INTRAMUSCULAR | Status: DC | PRN
  Administered 2017-08-29 (×2): 40 ug via INTRAVENOUS

## 2017-08-29 MED ORDER — LIDOCAINE HCL (CARDIAC) 20 MG/ML IV SOLN
INTRAVENOUS | Status: DC | PRN
  Administered 2017-08-29: 60 mg via INTRAVENOUS

## 2017-08-29 MED ORDER — FENTANYL CITRATE (PF) 100 MCG/2ML IJ SOLN
100.0000 ug | Freq: Once | INTRAMUSCULAR | Status: AC
  Administered 2017-08-29: 100 ug via INTRAVENOUS
  Filled 2017-08-29: qty 2

## 2017-08-29 MED ORDER — ONDANSETRON HCL 4 MG/2ML IJ SOLN
INTRAMUSCULAR | Status: AC
  Filled 2017-08-29: qty 2

## 2017-08-29 MED ORDER — 0.9 % SODIUM CHLORIDE (POUR BTL) OPTIME
TOPICAL | Status: DC | PRN
  Administered 2017-08-29: 1000 mL

## 2017-08-29 MED ORDER — OXYCODONE HCL 5 MG/5ML PO SOLN: 5.0000 mg | Freq: Once | ORAL | Status: DC | PRN

## 2017-08-29 MED ORDER — SUGAMMADEX SODIUM 500 MG/5ML IV SOLN
INTRAVENOUS | Status: DC | PRN
  Administered 2017-08-29: 200 mg via INTRAVENOUS

## 2017-08-29 MED ORDER — MIDAZOLAM HCL 2 MG/2ML IJ SOLN
INTRAMUSCULAR | Status: AC
  Filled 2017-08-29: qty 2

## 2017-08-29 MED ORDER — INSULIN ASPART 100 UNIT/ML ~~LOC~~ SOLN
SUBCUTANEOUS | Status: DC | PRN
  Administered 2017-08-29: 10 [IU] via INTRAVENOUS

## 2017-08-29 SURGICAL SUPPLY — 65 items
ANCH SUT SHRT 12.5 CANN EYLT (Anchor) ×5 IMPLANT
ANCHOR SUT BIOCOMP LK 2.9X12.5 (Anchor) ×5 IMPLANT
BLADE CUTTER GATOR 3.5 (BLADE) IMPLANT
BLADE GREAT WHITE 4.2 (BLADE) IMPLANT
BLADE SURG 11 STRL SS (BLADE) IMPLANT
CANNULA 5.75X71 LONG (CANNULA) ×2 IMPLANT
CANNULA TWIST IN 8.25X7CM (CANNULA) ×2 IMPLANT
COVER SURGICAL LIGHT HANDLE (MISCELLANEOUS) ×2 IMPLANT
DRAPE INCISE IOBAN 66X45 STRL (DRAPES) ×2 IMPLANT
DRAPE STERI 35X30 U-POUCH (DRAPES) ×4 IMPLANT
DRSG TEGADERM 4X4.75 (GAUZE/BANDAGES/DRESSINGS) ×5 IMPLANT
DURAPREP 26ML APPLICATOR (WOUND CARE) ×2 IMPLANT
ELECT REM PT RETURN 9FT ADLT (ELECTROSURGICAL) ×2
ELECTRODE REM PT RTRN 9FT ADLT (ELECTROSURGICAL) ×1 IMPLANT
FIBERSTICK 2 (SUTURE) ×1 IMPLANT
GAUZE SPONGE 4X4 12PLY STRL (GAUZE/BANDAGES/DRESSINGS) ×2 IMPLANT
GAUZE XEROFORM 1X8 LF (GAUZE/BANDAGES/DRESSINGS) ×2 IMPLANT
GLOVE BIOGEL PI IND STRL 8 (GLOVE) ×1 IMPLANT
GLOVE BIOGEL PI INDICATOR 8 (GLOVE) ×1
GLOVE INDICATOR 6.5 STRL GRN (GLOVE) ×1 IMPLANT
GLOVE INDICATOR 7.0 STRL GRN (GLOVE) ×1 IMPLANT
GLOVE SURG ORTHO 8.0 STRL STRW (GLOVE) ×2 IMPLANT
GLOVE SURG SS PI 6.5 STRL IVOR (GLOVE) ×2 IMPLANT
GLOVE SURG SS PI 7.0 STRL IVOR (GLOVE) ×2 IMPLANT
GOWN STRL REUS W/ TWL LRG LVL3 (GOWN DISPOSABLE) ×3 IMPLANT
GOWN STRL REUS W/TWL LRG LVL3 (GOWN DISPOSABLE) ×6
KIT BASIN OR (CUSTOM PROCEDURE TRAY) ×2 IMPLANT
KIT PERC INSERT 3.0 KNTLS (KITS) ×1 IMPLANT
KIT PUSHLOCK 2.9 HIP (KITS) ×1 IMPLANT
KIT ROOM TURNOVER OR (KITS) ×2 IMPLANT
LASSO 90 CVE QUICKPAS (DISPOSABLE) ×1 IMPLANT
LASSO CRESCENT QUICKPASS (SUTURE) IMPLANT
MANIFOLD NEPTUNE II (INSTRUMENTS) ×2 IMPLANT
NDL SPNL 18GX3.5 QUINCKE PK (NEEDLE) ×1 IMPLANT
NEEDLE SPNL 18GX3.5 QUINCKE PK (NEEDLE) ×4 IMPLANT
NS IRRIG 1000ML POUR BTL (IV SOLUTION) ×2 IMPLANT
PACK SHOULDER (CUSTOM PROCEDURE TRAY) ×2 IMPLANT
PAD ARMBOARD 7.5X6 YLW CONV (MISCELLANEOUS) ×4 IMPLANT
SET ARTHROSCOPY TUBING (MISCELLANEOUS) ×2
SET ARTHROSCOPY TUBING LN (MISCELLANEOUS) ×1 IMPLANT
SLING ARM IMMOBILIZER MED (SOFTGOODS) ×1 IMPLANT
SPONGE LAP 4X18 X RAY DECT (DISPOSABLE) ×2 IMPLANT
STRIP CLOSURE SKIN 1/2X4 (GAUZE/BANDAGES/DRESSINGS) ×1 IMPLANT
SUT ETHILON 3 0 FSL (SUTURE) ×1 IMPLANT
SUT ETHILON 3 0 PS 1 (SUTURE) ×4 IMPLANT
SUT FIBERWIRE 2-0 18 17.9 3/8 (SUTURE)
SUT MNCRL AB 3-0 PS2 18 (SUTURE) ×1 IMPLANT
SUT PDS AB 1 CT  36 (SUTURE) ×1
SUT PDS AB 1 CT 36 (SUTURE) IMPLANT
SUT VIC AB 0 CT1 27 (SUTURE) ×4
SUT VIC AB 0 CT1 27XBRD ANBCTR (SUTURE) IMPLANT
SUT VIC AB 2-0 CT1 27 (SUTURE) ×2
SUT VIC AB 2-0 CT1 36 (SUTURE) ×2 IMPLANT
SUT VIC AB 2-0 CT1 TAPERPNT 27 (SUTURE) IMPLANT
SUT VIC AB 3-0 X1 27 (SUTURE) ×1 IMPLANT
SUTURE FIBERWR 2-0 18 17.9 3/8 (SUTURE) IMPLANT
SYR 20CC LL (SYRINGE) ×2 IMPLANT
SYR 30ML LL (SYRINGE) ×2 IMPLANT
SYSTEM TENDODESIS IMPLANT (Miscellaneous) ×1 IMPLANT
TAPE LABRALWHITE 1.5X36 (TAPE) IMPLANT
TAPE SUT LABRALTAP WHT/BLK (SUTURE) ×3 IMPLANT
TOWEL OR 17X24 6PK STRL BLUE (TOWEL DISPOSABLE) ×2 IMPLANT
TOWEL OR 17X26 10 PK STRL BLUE (TOWEL DISPOSABLE) ×2 IMPLANT
WAND HAND CNTRL MULTIVAC 90 (MISCELLANEOUS) IMPLANT
WATER STERILE IRR 1000ML POUR (IV SOLUTION) ×1 IMPLANT

## 2017-08-29 NOTE — Op Note (Signed)
NAME:  Alex Tucker                  ACCOUNT NO.:  [id number]  MEDICAL RECORD NO.:  [id number]  LOCATION:  [facility]                         FACILITY:  [facility]  PHYSICIAN:  Anderson Malta, M.D.    DATE OF BIRTH:  09/24/1970  DATE OF PROCEDURE:  08/29/2017 DATE OF DISCHARGE:  08/29/2017                              OPERATIVE REPORT   PREOPERATIVE DIAGNOSIS:  Right shoulder superior labrum anterior and posterior tear with labral tearing.  POSTOPERATIVE DIAGNOSIS:  Right shoulder superior labrum anterior and posterior tear with labral tearing.  PROCEDURES:  Right shoulder arthroscopy with labral repair, biceps tendon release, and open biceps tenodesis.  SURGEON:  Anderson Malta, MD.  ASSISTANT:  Laure Kidney.  INDICATIONS:  Alex Tucker is a 47 year old patient with significant labral pathology, who presents for operative management after explanation of risks and benefits.  The patient has not had any episodes of instability, but does report pain.  Prior arthroscopy 1 month ago demonstrated significant labral pathology.  OPERATIVE FINDINGS: 1. Examination under anesthesia, range of motion:  The patient had     full forward flexion, abduction, and external rotation at 15     degrees of abduction to about 60 degrees.  Shoulder stability on     the right and left was excellent and symmetric with 1+ posterior     and anterior instability, solid endpoint, less than a centimeter of     sulcus sign bilaterally. 2. Diagnostic and operative arthroscopy:  Superior labral tear     extending from about the 10 o'clock position to the 4 o'clock     position.  The biceps anchor was unstable.  The anterior-inferior     and posterior-inferior glenohumeral ligaments were intact and the     rotator cuff was intact.  PROCEDURE IN DETAIL:  The patient was brought to the operating room where general anesthetic was induced.  Preoperative antibiotics were administered.  Time-out was called.  The patient was placed  in lateral position with the peroneal nerve on the left, well padded and axillary roll placed.  The patient was examined under anesthesia prior to doing the positioning.  Right arm was then suspended in traction and 10 degrees of forward flexion and 45 degrees of abduction.  Pre-scrubbing was performed with peroxide, alcohol, and Betadine.  The area was then prepped with DuraPrep solution and draped in a sterile manner.  Charlie Pitter was used to cover the axilla.  Time-out was called.  Posterior portal created.  Anterior portal created under direct visualization. Diagnostic arthroscopy demonstrated pretty significant SLAP tear with unstable biceps anchor from the 10 o'clock to the 4 o'clock position. The anterior-inferior and posterior-inferior glenohumeral ligaments were intact.  The rotator cuff was intact.  At this time, a second anterior portal was created.  Cannulas were placed.  The labrum was prepared from the 12 o'clock to the 3 o'clock position and two anchors were placed to secure the labrum.  These were Arthrex PushLock anchors with FiberTape. Good repair was achieved.  One anchor placed in about the 10:30 position posteriorly using percutaneous kit.  This also gave excellent fixation of the labrum.  The biceps tendon was then released.  Biceps tendon was then tenodesed in a subpectoral fashion through an open incision using the Arthrex Endobutton in the intra-articular portion of the humerus. At this time, the incision was made.  The subpectoral region was identified.  Care was taken to avoid injury to neurovascular structures. A single unicortical hole was drilled and the biceps tendon was delivered into the incision beginning about a centimeter distal to the musculotendinous junction and extending about a centimeter and a half proximal to it.  FiberLoop was placed.  It was then secured into the hole with excellent fixation achieved.  At this time, thorough irrigation was performed  and that incision was closed using 0 Vicryl suture, 2-0 Vicryl suture, and 3-0 Monocryl.  Portals were closed using 2-0 Vicryl and 3-0 nylon.  Waterproof dressing was placed.  The patient tolerated the procedure well without immediate complication, transferred to the recovery room in stable condition.     Anderson Malta, M.D.     GSD/MEDQ  D:  08/29/2017  T:  08/29/2017  Job:  [id number]

## 2017-08-29 NOTE — Brief Op Note (Signed)
08/29/2017  5:35 PM  PATIENT:  Dorene GrebeBrian M Pearman  47 y.o. male  PRE-OPERATIVE DIAGNOSIS:  right shoulder labral tear  POST-OPERATIVE DIAGNOSIS:  right shoulder labral tear  PROCEDURE:  Procedure(s): RIGHT SHOULDER ARTHROSCOPY WITH DEBRIDEMENT, LABRAL REPAIR, BICEPS TENODESIS  SURGEON:  Surgeon(s): August Saucerean, Corrie MckusickGregory Scott, MD  ASSISTANT: Patrick Jupiterarla Bethune rnfa RNFA  ANESTHESIA:   general  EBL: 15 ml    Total I/O In: 1000 [I.V.:1000] Out: 50 [Blood:50]  BLOOD ADMINISTERED: none  DRAINS: none   LOCAL MEDICATIONS USED:  none  SPECIMEN:  No Specimen  COUNTS:  YES  TOURNIQUET:  * No tourniquets in log *  DICTATION: .Other Dictation: Dictation Number [id number]  PLAN OF CARE: Discharge to home after PACU  PATIENT DISPOSITION:  PACU - hemodynamically stable

## 2017-08-29 NOTE — Anesthesia Procedure Notes (Signed)
Procedure Name: Intubation Date/Time: 08/29/2017 2:37 PM Performed by: Achille RichHodierne, Adam, MD Pre-anesthesia Checklist: Patient identified, Emergency Drugs available, Suction available, Patient being monitored and Timeout performed Patient Re-evaluated:Patient Re-evaluated prior to induction Oxygen Delivery Method: Circle system utilized Preoxygenation: Pre-oxygenation with 100% oxygen Induction Type: IV induction Ventilation: Mask ventilation without difficulty and Oral airway inserted - appropriate to patient size Laryngoscope Size: Glidescope and 4 Grade View: Grade II Tube type: Oral Tube size: 7.5 mm Number of attempts: 1 Airway Equipment and Method: Video-laryngoscopy Placement Confirmation: ETT inserted through vocal cords under direct vision,  positive ETCO2 and breath sounds checked- equal and bilateral Secured at: 21 cm Tube secured with: Tape Dental Injury: Teeth and Oropharynx as per pre-operative assessment  Difficulty Due To: Difficulty was anticipated, Difficult Airway- due to limited oral opening and Difficult Airway- due to anterior larynx Comments: Elective glidescope due to past hx severe sore throat after surgery per pt.

## 2017-08-29 NOTE — Telephone Encounter (Signed)
error 

## 2017-08-29 NOTE — Anesthesia Preprocedure Evaluation (Signed)
Anesthesia Evaluation  Patient identified by MRN, date of birth, ID band Patient awake    Reviewed: Allergy & Precautions, H&P , NPO status , Patient's Chart, lab work & pertinent test results  Airway Mallampati: II   Neck ROM: full    Dental   Pulmonary Current Smoker,    breath sounds clear to auscultation       Cardiovascular negative cardio ROS   Rhythm:regular Rate:Normal     Neuro/Psych Seizures -,  H/o TBI    GI/Hepatic GERD  ,  Endo/Other  diabetes, Type 1, Insulin Dependent  Renal/GU      Musculoskeletal   Abdominal   Peds  Hematology   Anesthesia Other Findings   Reproductive/Obstetrics                             Anesthesia Physical Anesthesia Plan  ASA: III  Anesthesia Plan: General   Post-op Pain Management:  Regional for Post-op pain   Induction: Intravenous  PONV Risk Score and Plan: 1 and Ondansetron, Midazolam and Treatment may vary due to age or medical condition  Airway Management Planned: Oral ETT  Additional Equipment:   Intra-op Plan:   Post-operative Plan: Extubation in OR  Informed Consent: I have reviewed the patients History and Physical, chart, labs and discussed the procedure including the risks, benefits and alternatives for the proposed anesthesia with the patient or authorized representative who has indicated his/her understanding and acceptance.     Plan Discussed with: CRNA, Anesthesiologist and Surgeon  Anesthesia Plan Comments:         Anesthesia Quick Evaluation

## 2017-08-29 NOTE — Anesthesia Procedure Notes (Signed)
Anesthesia Regional Block: Interscalene brachial plexus block   Pre-Anesthetic Checklist: ,, timeout performed, Correct Patient, Correct Site, Correct Laterality, Correct Procedure, Correct Position, site marked, Risks and benefits discussed,  Surgical consent,  Pre-op evaluation,  At surgeon's request and post-op pain management  Laterality: Right  Prep: chloraprep       Needles:  Injection technique: Single-shot  Needle Type: Echogenic Stimulator Needle     Needle Length: 5cm  Needle Gauge: 22     Additional Needles:   Procedures:, nerve stimulator,,,,,,,   Nerve Stimulator or Paresthesia:  Response: biceps flexion, 0.45 mA,   Additional Responses:   Narrative:  Start time: 08/29/2017 1:50 PM End time: 08/29/2017 1:55 PM Injection made incrementally with aspirations every 5 mL.  Performed by: Personally  Anesthesiologist: Achille RichHodierne, Kassaundra Hair, MD  Additional Notes: Functioning IV was confirmed and monitors were applied.  A 50mm 22ga Arrow echogenic stimulator needle was used. Sterile prep and drape,hand hygiene and sterile gloves were used.  Negative aspiration and negative test dose prior to incremental administration of local anesthetic. The patient tolerated the procedure well.  Ultrasound guidance: relevent anatomy identified, needle position confirmed, local anesthetic spread visualized around nerve(s), vascular puncture avoided.  Image printed for medical record.

## 2017-08-29 NOTE — Transfer of Care (Signed)
Immediate Anesthesia Transfer of Care Note  Patient: Alex GrebeBrian M Tucker  Procedure(s) Performed: RIGHT SHOULDER ARTHROSCOPY WITH DEBRIDEMENT, LABRAL REPAIR, BICEPS TENODESIS (Right Shoulder)  Patient Location: PACU  Anesthesia Type:General  Level of Consciousness: awake, alert , oriented and patient cooperative  Airway & Oxygen Therapy: Patient Spontanous Breathing  Post-op Assessment: Report given to RN, Post -op Vital signs reviewed and stable and Patient moving all extremities X 4  Post vital signs: Reviewed and stable  Last Vitals:  Vitals:   08/29/17 1055 08/29/17 1735  BP: 125/79 139/76  Pulse: 75 92  Resp: 18 19  Temp: 36.6 C   SpO2: 97% 95%    Last Pain:  Vitals:   08/29/17 1055  TempSrc: Oral      Patients Stated Pain Goal: 3 (08/29/17 1110)  Complications: No apparent anesthesia complications
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
## 2017-08-30 NOTE — Anesthesia Postprocedure Evaluation (Signed)
Anesthesia Post Note  Patient: Alex Tucker  Procedure(s) Performed: RIGHT SHOULDER ARTHROSCOPY WITH DEBRIDEMENT, LABRAL REPAIR, BICEPS TENODESIS (Right Shoulder)     Patient location during evaluation: PACU Anesthesia Type: General Level of consciousness: awake and alert Pain management: pain level controlled Vital Signs Assessment: post-procedure vital signs reviewed and stable Respiratory status: spontaneous breathing, nonlabored ventilation and respiratory function stable Cardiovascular status: blood pressure returned to baseline and stable Postop Assessment: no apparent nausea or vomiting Anesthetic complications: no    Last Vitals:  Vitals:   08/29/17 1815 08/29/17 1819  BP:  115/77  Pulse: 85 85  Resp: 14 14  Temp:  (!) 36.3 C  SpO2: 94% 91%    Last Pain:  Vitals:   08/29/17 1055  TempSrc: Oral                 Alex Tucker

## 2017-08-31 ENCOUNTER — Encounter (HOSPITAL_COMMUNITY): Payer: Self-pay | Admitting: Orthopedic Surgery

## 2017-09-04 ENCOUNTER — Ambulatory Visit (INDEPENDENT_AMBULATORY_CARE_PROVIDER_SITE_OTHER): Payer: Medicare Other | Admitting: Orthopedic Surgery

## 2017-09-04 ENCOUNTER — Encounter (INDEPENDENT_AMBULATORY_CARE_PROVIDER_SITE_OTHER): Payer: Self-pay | Admitting: Orthopedic Surgery

## 2017-09-04 ENCOUNTER — Other Ambulatory Visit (INDEPENDENT_AMBULATORY_CARE_PROVIDER_SITE_OTHER): Payer: Self-pay

## 2017-09-04 DIAGNOSIS — Z9889 Other specified postprocedural states: Secondary | ICD-10-CM
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
## 2017-09-13 ENCOUNTER — Telehealth: Payer: Self-pay | Admitting: Nutrition

## 2017-09-13 NOTE — Telephone Encounter (Signed)
Pt,. Says his pump is not our of [place]ren[place] for 1 1/3721yrs.  He is interested in a CGM, but had shoulder surgery last week,and is still in a lot of pain.  Wants to wait 2-3 weeks, and then will call me set up a time to discuss options

## 2017-09-22 ENCOUNTER — Other Ambulatory Visit: Payer: Self-pay

## 2017-09-22 ENCOUNTER — Ambulatory Visit (HOSPITAL_COMMUNITY): Payer: Managed Care, Other (non HMO) | Attending: Orthopedic Surgery | Admitting: Occupational Therapy

## 2017-09-22 ENCOUNTER — Encounter (HOSPITAL_COMMUNITY): Payer: Self-pay | Admitting: Occupational Therapy

## 2017-09-22 DIAGNOSIS — R29898 Other symptoms and signs involving the musculoskeletal system: Secondary | ICD-10-CM | POA: Insufficient documentation

## 2017-09-22 DIAGNOSIS — M25611 Stiffness of right shoulder, not elsewhere classified: Secondary | ICD-10-CM | POA: Insufficient documentation

## 2017-09-22 DIAGNOSIS — M25511 Pain in right shoulder: Secondary | ICD-10-CM | POA: Diagnosis present
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
## 2017-09-22 NOTE — Patient Instructions (Signed)
SHOULDER: Flexion On Table   Place hands on table, elbows straight. Move hips away from body. Press hands down into table.  _15__ reps per set, __3_ sets per day  Abduction (Passive)   With arm out to side, resting on table, lower head toward arm, keeping trunk away from table.  Repeat __15__ times. Do _3___ sessions per day.  Copyright  VHI. All rights reserved.     Internal Rotation (Assistive)   Seated with elbow bent at right angle and held against side, slide arm on table surface in an inward arc. Repeat __15__ times. Do __3__ sessions per day. Activity: Use this motion to brush crumbs off the table.  Copyright  VHI. All rights reserved.     1) Seated Row   Sit up straight with elbows by your sides. Pull back with shoulders/elbows, keeping forearms straight, as if pulling back on the reins of a horse. Squeeze shoulder blades together. Repeat _15__times, __3__sets/day    2) Shoulder Elevation    Sit up straight with arms by your sides. Slowly bring your shoulders up towards your ears. Repeat_15__times, _3___ sets/day    3) Shoulder Extension    Sit up straight with both arms by your side, draw your arms back behind your waist. Keep your elbows straight. Repeat __15__times, __3__sets/day. 15

## 2017-09-26 ENCOUNTER — Telehealth (INDEPENDENT_AMBULATORY_CARE_PROVIDER_SITE_OTHER): Payer: Self-pay | Admitting: Orthopedic Surgery

## 2017-09-26 MED ORDER — METHOCARBAMOL 500 MG PO TABS: 500.0000 mg | ORAL_TABLET | Freq: Three times a day (TID) | ORAL | 0 refills | Status: AC | PRN
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
## 2017-09-26 NOTE — Telephone Encounter (Signed)
Submitted to pharmacy. I LMVM for patient advising done.

## 2017-09-26 NOTE — Telephone Encounter (Signed)
Patient called advised he is having really bad muscle spasms in his right shoulder. Patient asked if Dr. August Saucerean want to write him a Rx for the spasms. The number to contact patient is [phone]

## 2017-09-26 NOTE — Telephone Encounter (Signed)
Please advise. Thanks.  

## 2017-09-26 NOTE — Telephone Encounter (Signed)
Ok for robaxin 500 po aq 8 prn

## 2017-09-27 ENCOUNTER — Ambulatory Visit (INDEPENDENT_AMBULATORY_CARE_PROVIDER_SITE_OTHER): Payer: Medicare Other | Admitting: Endocrinology

## 2017-09-27 ENCOUNTER — Encounter: Payer: Self-pay | Admitting: Endocrinology

## 2017-09-27 VITALS — BP 118/70 | HR 72 | Ht 64.0 in | Wt 173.0 lb

## 2017-09-27 DIAGNOSIS — E1065 Type 1 diabetes mellitus with hyperglycemia: Secondary | ICD-10-CM | POA: Diagnosis not present

## 2017-09-27 NOTE — Patient Instructions (Signed)
Extra 30% for hi fat meals

## 2017-09-29 ENCOUNTER — Ambulatory Visit (HOSPITAL_COMMUNITY): Payer: Managed Care, Other (non HMO) | Admitting: Occupational Therapy

## 2017-09-29 ENCOUNTER — Encounter (HOSPITAL_COMMUNITY): Payer: Self-pay | Admitting: Occupational Therapy

## 2017-09-29 DIAGNOSIS — R29898 Other symptoms and signs involving the musculoskeletal system: Secondary | ICD-10-CM

## 2017-09-29 DIAGNOSIS — M25511 Pain in right shoulder: Secondary | ICD-10-CM

## 2017-09-29 DIAGNOSIS — M25611 Stiffness of right shoulder, not elsewhere classified: Secondary | ICD-10-CM
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
## 2017-10-04 ENCOUNTER — Encounter (HOSPITAL_COMMUNITY): Payer: Self-pay

## 2017-10-04 ENCOUNTER — Ambulatory Visit (HOSPITAL_COMMUNITY): Payer: Managed Care, Other (non HMO)

## 2017-10-04 DIAGNOSIS — M25511 Pain in right shoulder: Secondary | ICD-10-CM

## 2017-10-04 DIAGNOSIS — M25611 Stiffness of right shoulder, not elsewhere classified: Secondary | ICD-10-CM

## 2017-10-04 DIAGNOSIS — R29898 Other symptoms and signs involving the musculoskeletal system: Secondary | ICD-10-CM
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
## 2017-10-04 NOTE — Patient Instructions (Signed)
Perform each exercise ____12-15____ reps. 2-3x days.   Protraction - STANDING  Start by holding a wand or cane at chest height.  Next, slowly push the wand outwards in front of your body so that your elbows become fully straightened. Then, return to the original position.     Shoulder FLEXION - STANDING - PALMS DOWN  In the standing position, hold a wand/cane with both arms, palms down on both sides. Raise up the wand/cane allowing your unaffected arm to perform most of the effort. Your affected arm should be partially relaxed.      Internal/External ROTATION - STANDING  In the standing position, hold a wand/cane with both hands keeping your elbows bent. Move your arms and wand/cane to one side.  Your affected arm should be partially relaxed while your unaffected arm performs most of the effort.       Shoulder ABDUCTION - STANDING  While holding a wand/cane palm face up on the injured side and palm face down on the uninjured side, slowly raise up your injured arm to the side.           Horizontal Abduction/Adduction      Straight arms holding cane at shoulder height, bring cane to right, center, left. Repeat starting to left.   Copyright  VHI. All rights reserved.       

## 2017-10-05 ENCOUNTER — Ambulatory Visit (INDEPENDENT_AMBULATORY_CARE_PROVIDER_SITE_OTHER): Payer: Medicare Other | Admitting: Orthopedic Surgery

## 2017-10-05 ENCOUNTER — Encounter (INDEPENDENT_AMBULATORY_CARE_PROVIDER_SITE_OTHER): Payer: Self-pay | Admitting: Orthopedic Surgery

## 2017-10-05 ENCOUNTER — Telehealth (INDEPENDENT_AMBULATORY_CARE_PROVIDER_SITE_OTHER): Payer: Self-pay

## 2017-10-05 DIAGNOSIS — Z9889 Other specified postprocedural states: Secondary | ICD-10-CM

## 2017-10-05 NOTE — Telephone Encounter (Signed)
Per patients request, faxed updated work note from todays office visit to [phone] per his request

## 2017-10-06 ENCOUNTER — Encounter (HOSPITAL_COMMUNITY): Payer: Self-pay | Admitting: Occupational Therapy

## 2017-10-06 ENCOUNTER — Ambulatory Visit (HOSPITAL_COMMUNITY): Payer: Managed Care, Other (non HMO) | Admitting: Occupational Therapy

## 2017-10-06 DIAGNOSIS — M25611 Stiffness of right shoulder, not elsewhere classified: Secondary | ICD-10-CM

## 2017-10-06 DIAGNOSIS — M25511 Pain in right shoulder: Secondary | ICD-10-CM

## 2017-10-06 DIAGNOSIS — R29898 Other symptoms and signs involving the musculoskeletal system: Secondary | ICD-10-CM
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
## 2017-10-06 NOTE — Patient Instructions (Signed)

## 2017-10-07 ENCOUNTER — Encounter (INDEPENDENT_AMBULATORY_CARE_PROVIDER_SITE_OTHER): Payer: Self-pay | Admitting: Orthopedic Surgery
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
## 2017-10-11 ENCOUNTER — Ambulatory Visit (HOSPITAL_COMMUNITY): Payer: Managed Care, Other (non HMO) | Admitting: Specialist

## 2017-10-11 ENCOUNTER — Encounter (HOSPITAL_COMMUNITY): Payer: Self-pay | Admitting: Specialist

## 2017-10-11 DIAGNOSIS — M25511 Pain in right shoulder: Secondary | ICD-10-CM

## 2017-10-11 DIAGNOSIS — R29898 Other symptoms and signs involving the musculoskeletal system: Secondary | ICD-10-CM

## 2017-10-11 DIAGNOSIS — M25611 Stiffness of right shoulder, not elsewhere classified: Secondary | ICD-10-CM
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
## 2017-10-13 ENCOUNTER — Telehealth (HOSPITAL_COMMUNITY): Payer: Self-pay | Admitting: Internal Medicine

## 2017-10-13 ENCOUNTER — Ambulatory Visit (HOSPITAL_COMMUNITY): Payer: Managed Care, Other (non HMO) | Admitting: Occupational Therapy

## 2017-10-13 NOTE — Telephone Encounter (Signed)
10/13/17 pt called at 10:10 he thought his appt was at 10:30 this morning and he apologized for not showing up.

## 2017-10-18 ENCOUNTER — Encounter (HOSPITAL_COMMUNITY): Payer: Self-pay | Admitting: Occupational Therapy

## 2017-10-18 ENCOUNTER — Ambulatory Visit (HOSPITAL_COMMUNITY): Payer: Managed Care, Other (non HMO) | Admitting: Occupational Therapy

## 2017-10-18 DIAGNOSIS — M25611 Stiffness of right shoulder, not elsewhere classified: Secondary | ICD-10-CM

## 2017-10-18 DIAGNOSIS — M25511 Pain in right shoulder: Secondary | ICD-10-CM | POA: Diagnosis not present

## 2017-10-18 DIAGNOSIS — R29898 Other symptoms and signs involving the musculoskeletal system: Secondary | ICD-10-CM

## 2017-10-18 NOTE — Patient Instructions (Signed)
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
## 2017-10-20 ENCOUNTER — Encounter (HOSPITAL_COMMUNITY): Payer: Self-pay | Admitting: Occupational Therapy

## 2017-10-20 ENCOUNTER — Ambulatory Visit (HOSPITAL_COMMUNITY): Payer: Managed Care, Other (non HMO) | Attending: Orthopedic Surgery | Admitting: Occupational Therapy

## 2017-10-20 DIAGNOSIS — M25611 Stiffness of right shoulder, not elsewhere classified: Secondary | ICD-10-CM | POA: Diagnosis present

## 2017-10-20 DIAGNOSIS — M25511 Pain in right shoulder: Secondary | ICD-10-CM | POA: Insufficient documentation

## 2017-10-20 DIAGNOSIS — R29898 Other symptoms and signs involving the musculoskeletal system: Secondary | ICD-10-CM | POA: Diagnosis present
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
## 2017-10-24 ENCOUNTER — Ambulatory Visit (HOSPITAL_COMMUNITY): Payer: Managed Care, Other (non HMO) | Admitting: Occupational Therapy

## 2017-10-24 ENCOUNTER — Encounter (HOSPITAL_COMMUNITY): Payer: Self-pay | Admitting: Occupational Therapy

## 2017-10-24 DIAGNOSIS — R29898 Other symptoms and signs involving the musculoskeletal system: Secondary | ICD-10-CM

## 2017-10-24 DIAGNOSIS — M25511 Pain in right shoulder: Secondary | ICD-10-CM

## 2017-10-24 DIAGNOSIS — M25611 Stiffness of right shoulder, not elsewhere classified: Secondary | ICD-10-CM
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
## 2017-10-26 ENCOUNTER — Ambulatory Visit (HOSPITAL_COMMUNITY): Payer: Managed Care, Other (non HMO) | Admitting: Occupational Therapy

## 2017-10-26 ENCOUNTER — Encounter (INDEPENDENT_AMBULATORY_CARE_PROVIDER_SITE_OTHER): Payer: Self-pay | Admitting: Orthopedic Surgery

## 2017-10-26 ENCOUNTER — Ambulatory Visit (INDEPENDENT_AMBULATORY_CARE_PROVIDER_SITE_OTHER): Payer: Medicare Other | Admitting: Orthopedic Surgery

## 2017-10-26 ENCOUNTER — Other Ambulatory Visit: Payer: Self-pay

## 2017-10-26 DIAGNOSIS — M25511 Pain in right shoulder: Secondary | ICD-10-CM

## 2017-10-26 DIAGNOSIS — R29898 Other symptoms and signs involving the musculoskeletal system: Secondary | ICD-10-CM

## 2017-10-26 DIAGNOSIS — Z9889 Other specified postprocedural states: Secondary | ICD-10-CM

## 2017-10-26 DIAGNOSIS — M25611 Stiffness of right shoulder, not elsewhere classified: Secondary | ICD-10-CM
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
## 2017-10-29 ENCOUNTER — Encounter (INDEPENDENT_AMBULATORY_CARE_PROVIDER_SITE_OTHER): Payer: Self-pay | Admitting: Orthopedic Surgery
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
## 2017-11-01 ENCOUNTER — Encounter (HOSPITAL_COMMUNITY): Payer: Self-pay | Admitting: Occupational Therapy

## 2017-11-01 ENCOUNTER — Ambulatory Visit (HOSPITAL_COMMUNITY): Payer: Managed Care, Other (non HMO) | Admitting: Occupational Therapy

## 2017-11-01 DIAGNOSIS — R29898 Other symptoms and signs involving the musculoskeletal system: Secondary | ICD-10-CM

## 2017-11-01 DIAGNOSIS — M25611 Stiffness of right shoulder, not elsewhere classified: Secondary | ICD-10-CM

## 2017-11-01 DIAGNOSIS — M25511 Pain in right shoulder: Secondary | ICD-10-CM | POA: Diagnosis not present
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
## 2017-11-03 ENCOUNTER — Ambulatory Visit (HOSPITAL_COMMUNITY): Payer: Managed Care, Other (non HMO) | Admitting: Occupational Therapy

## 2017-11-03 ENCOUNTER — Encounter (HOSPITAL_COMMUNITY): Payer: Self-pay | Admitting: Occupational Therapy

## 2017-11-03 DIAGNOSIS — M25611 Stiffness of right shoulder, not elsewhere classified: Secondary | ICD-10-CM

## 2017-11-03 DIAGNOSIS — R29898 Other symptoms and signs involving the musculoskeletal system: Secondary | ICD-10-CM

## 2017-11-03 DIAGNOSIS — M25511 Pain in right shoulder: Secondary | ICD-10-CM | POA: Diagnosis not present
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
## 2017-11-03 NOTE — Patient Instructions (Signed)
1) Wall slide: Place an elastic band around your arms at the level of your wrists as shown. Next, place your forearms and hands along a wall so that your elbows are bent and your arms point towards the ceiling.  Then, protract your shoulder blades forward and then slide your arms up the wall as shown.   2) Lateral Wall Walks: With hands against wall, walk or slide your hands to the side against resistance of the band.   3) Upward/Diagonal Wall Walks: Walk or slide your hand up the wall in a diagonal direction, going against the resistance of the band.  4) Modified Plank Plus: Perform a plank on your knees and elbows as shown and sustain the hold. While holding, protract your shoulder blades forward to raise up a few more inches and then return to original position.  5) Straight arm push-ups: Start in a push up position on your hands and leaning up against a table or counter top as shown. Maintain this position as you protract your shoulder blades forward to raise your body upward a few inches. Then, return to original position.

## 2017-11-08 ENCOUNTER — Ambulatory Visit (HOSPITAL_COMMUNITY): Payer: Managed Care, Other (non HMO) | Admitting: Occupational Therapy

## 2017-11-08 ENCOUNTER — Encounter (HOSPITAL_COMMUNITY): Payer: Self-pay | Admitting: Occupational Therapy

## 2017-11-08 DIAGNOSIS — R29898 Other symptoms and signs involving the musculoskeletal system: Secondary | ICD-10-CM

## 2017-11-08 DIAGNOSIS — M25511 Pain in right shoulder: Secondary | ICD-10-CM

## 2017-11-08 DIAGNOSIS — M25611 Stiffness of right shoulder, not elsewhere classified: Secondary | ICD-10-CM
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
## 2017-11-27 ENCOUNTER — Ambulatory Visit (INDEPENDENT_AMBULATORY_CARE_PROVIDER_SITE_OTHER): Payer: Medicare Other | Admitting: Endocrinology

## 2017-11-27 ENCOUNTER — Encounter: Payer: Self-pay | Admitting: Endocrinology

## 2017-11-27 VITALS — BP 126/70 | HR 81 | Ht 64.0 in | Wt 178.0 lb

## 2017-11-27 DIAGNOSIS — E1065 Type 1 diabetes mellitus with hyperglycemia: Secondary | ICD-10-CM

## 2017-11-27 LAB — LIPID PANEL
Cholesterol: 139 (ref 0–200)
HDL: 34 — AB (ref 35–70)
LDL CALC: 89
TRIGLYCERIDES: 80 (ref 40–160)

## 2017-11-27 LAB — POCT GLYCOSYLATED HEMOGLOBIN (HGB A1C): Hemoglobin A1C: 8.1

## 2017-11-27 MED ORDER — FREESTYLE LIBRE 14 DAY READER DEVI: 1.0000 | Freq: Once | 0 refills | Status: AC

## 2017-11-27 MED ORDER — FREESTYLE LIBRE 14 DAY SENSOR MISC: 1.0000 [IU] | 4 refills | Status: DC
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
## 2017-12-20 ENCOUNTER — Telehealth: Payer: Self-pay | Admitting: Endocrinology

## 2017-12-20 NOTE — Telephone Encounter (Signed)
Alex Tucker is calling on status of paper work they faxed over to [place] 4/25 This was a request for a detailed written order for the patients order Diabetic supply(test strips)     Phone-[phone] Ref [id number]  Fax- [phone]

## 2017-12-21 NOTE — Telephone Encounter (Signed)
Spoke with Alex Tucker from [place] and she stated that the fax was received for patient's diabetic testing supplies.

## 2017-12-25 ENCOUNTER — Other Ambulatory Visit: Payer: Self-pay

## 2017-12-25 ENCOUNTER — Telehealth: Payer: Self-pay | Admitting: Endocrinology

## 2017-12-25 MED ORDER — FREESTYLE LIBRE 14 DAY SENSOR MISC: 1.0000 [IU] | 4 refills | Status: DC

## 2017-12-25 NOTE — Telephone Encounter (Signed)
Marylene Land from [facility] is needing a detailed written prescription for test strips [phone]

## 2017-12-26 ENCOUNTER — Other Ambulatory Visit: Payer: Self-pay

## 2017-12-26 MED ORDER — FREESTYLE LIBRE 14 DAY SENSOR MISC: 1.0000 [IU] | 4 refills | Status: DC

## 2017-12-26 MED ORDER — FREESTYLE LIBRE 14 DAY SENSOR MISC
1.0000 [IU] | 4 refills | Status: DC
Start: 2017-12-26 — End: 2018-02-26

## 2017-12-26 NOTE — Telephone Encounter (Signed)
Specific order sent to pharmacy.

## 2017-12-26 NOTE — Telephone Encounter (Signed)
Printed script given to MD for signature.

## 2017-12-29 ENCOUNTER — Telehealth: Payer: Self-pay | Admitting: Endocrinology

## 2017-12-29 NOTE — Telephone Encounter (Signed)
[facility] calling concerning PA,stated that patient Freestyle libre14 day has been denied, because excluded Medicare part D coverage. Please advise [phone] opt 5

## 2018-01-19 ENCOUNTER — Encounter: Payer: Self-pay | Admitting: Endocrinology

## 2018-02-19 ENCOUNTER — Other Ambulatory Visit: Payer: Self-pay | Admitting: Endocrinology

## 2018-02-19 ENCOUNTER — Other Ambulatory Visit (INDEPENDENT_AMBULATORY_CARE_PROVIDER_SITE_OTHER): Payer: Medicare Other

## 2018-02-19 DIAGNOSIS — E1065 Type 1 diabetes mellitus with hyperglycemia: Secondary | ICD-10-CM

## 2018-02-19 LAB — BASIC METABOLIC PANEL
BUN: 16 mg/dL (ref 6–23)
CO2: 29 meq/L (ref 19–32)
CREATININE: 1.06 mg/dL (ref 0.40–1.50)
Calcium: 9.1 mg/dL (ref 8.4–10.5)
Chloride: 104 mEq/L (ref 96–112)
GFR: 79.86 mL/min (ref >60.00)
GLUCOSE: 128 mg/dL — AB (ref 70–99)
Potassium: 4.1 mEq/L (ref 3.5–5.1)
Sodium: 138 mEq/L (ref 135–145)

## 2018-02-19 LAB — MICROALBUMIN / CREATININE URINE RATIO
Creatinine,U: 70.9 mg/dL
MICROALB/CREAT RATIO: 1 mg/g (ref 0.0–30.0)
Microalb, Ur: <0.7 mg/dL (ref 0.0–1.9)

## 2018-02-19 LAB — HEMOGLOBIN A1C: Hgb A1c MFr Bld: 8.6 % — ABNORMAL HIGH (ref 4.6–6.5)

## 2018-02-21 ENCOUNTER — Other Ambulatory Visit: Payer: Self-pay | Admitting: Endocrinology

## 2018-02-23 ENCOUNTER — Other Ambulatory Visit: Payer: Self-pay

## 2018-02-23 MED ORDER — INSULIN LISPRO 100 UNIT/ML ~~LOC~~ SOLN: SUBCUTANEOUS | 3 refills | Status: DC

## 2018-02-25 NOTE — Progress Notes (Signed)
Patient ID: Alex Tucker, male   DOB: [birth year], 47 y.o.   MRN: [id number]    Reason for Appointment: F/u for Type 2 Diabetes   History of Present Illness:          Diagnosis: Type 1 diabetes mellitus, date of diagnosis:  2014      Past history:  He apparently had no symptoms at diagnosis and his blood sugar a year before at his physical was normal Details of his initial diagnosis are not available but he thinks his blood sugars were about 180 at that time with A1c around 9% He was initially tried on metformin but he could not tolerate this because of diarrhea Subsequently on his own he was trying to lose weight with diet and apparently lost 40 pounds around the end of 2014 His PCP started him on insulin in 12/14 approximately when his A1c was high and he was also losing weight. His initial consultation he was told to try Invokana and Victoza along with his basal insulin but he could not get Victoza because of non-coverage by his insurance.  Also he was having side effects of not feeling well and having some nausea with Invokana and stopped this in 8/15 after Also his Levemir was changed to Lantus insulin once a day; initially was started on 20 units   Recent history:   He  Has been on a Medtronic  Pump since 08/29/14 because of poor control with basal bolus insulin regimens;  prior to going on the pump A1c was over 10%  PUMP settings:Basal rate at midnight = 1.0, 5 AM = 1.2, 7 AM = 1.45 10 AM = 1.15, 1 PM = 1.25, 4 PM = 1.5 , 8:30 PM = 1.6  Carbohydrate coverage 1:7 until 8 AM, 8 PM-2 PM noon = 5.0, and 1: 4 dinner Sensitivity 1:30 at midnight , 1: 20) 2 PM and then 1:20,target 120 Active insulin 4 hours  A1c is now 8.6, previously 8.1  Current management, blood sugar patterns and problems identified are as follows:   He has not been checking his blood sugars again as directed and less than 3 times a day  He has gained not been able to start on a CGM device because he  says his insurance will not cover the freestyle libre despite his diagnosis of type I  He says his blood sugars are higher in the morning because of rebound from low sugars and he does not routinely check his sugars in the mornings  Also on a couple of mornings his blood sugars have been as low as 56 and 116  HYPOGLYCEMIA appears to be mostly in the evenings around 7-9 PM and likely related to increased activity at work but he is not checking his blood sugars while at work  He may tend to get blood sugars as low as 35 with little warning before the blood sugars going down  With his work he may rarely get low blood sugars in a delayed manner and not while being active  He is treating his hypoglycemia with peanut butter crackers mostly as he thinks this works well but often will not do a follow-up blood sugar after his sugar is low, blood sugar appears to be rebounding subsequently  Currently he is eating about 135 g of carbohydrates in the whole day and his wife is asking about his going on a very low carbohydrate diet to improve his control  POSTPRANDIAL readings are difficult to assess as  he only rarely has postprandial readings checked, may be relatively higher after lunch occasionally  His work schedule is quite variable in the afternoons and evenings, usually not working after 10 PM  Also his overall control may be worse because he says that in June he was [place] the beach  His C-peptide level has been undetectable as of 11/18  Mean values apply above for all meters except median for One Touch  PRE-MEAL Fasting Lunch Dinner Bedtime Overall  Glucose range:  56-364  102-300  51-279  36-347   Mean/median:     152   POST-MEAL PC Breakfast PC Lunch PC Dinner  Glucose range:     Mean/median:   197    Previous readings  PRE-MEAL Fasting Lunch Dinner Bedtime Overall  Glucose range:  72-292   55-246    Mean/median:      172+/-113   POST-MEAL PC Breakfast PC Lunch  PC Dinner  Glucose range:    204, 247  Mean/median:      Side effects from medications have been: Metformin causes diarrhea, Invokana causes nausea and malaise   Glycemic control:   Lab Results  Component Value Date   HGBA1C 8.6 (H) 02/19/2018   HGBA1C 8.1 11/27/2017   HGBA1C 8.0 (H) 08/29/2017   Lab Results  Component Value Date   MICROALBUR <0.7 02/19/2018   LDLCALC 89 11/27/2017   CREATININE 1.06 02/19/2018    Self-care: The diet that the patient has been following NW:[id number]:usually low fat  Meals: 3 meals per day. 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  Latest known visit with results is:  Lab on 02/19/2018  Component Date Value Ref Range Status  . Sodium 02/19/2018 138  135 - 145 mEq/L Final  . Potassium 02/19/2018 4.1  3.5 - 5.1 mEq/L Final  . Chloride 02/19/2018 104  96 - 112 mEq/L Final  . CO2 02/19/2018 29  19 - 32 mEq/L Final  . Glucose, Bld 02/19/2018 128* 70 - 99 mg/dL Final  . BUN 08/65/784607/08/2017 16  6 - 23 mg/dL Final  . Creatinine, Ser 02/19/2018 1.06  0.40 - 1.50 mg/dL Final  . Calcium 96/29/528407/08/2017 9.1  8.4 - 10.5 mg/dL Final  . GFR 13/24/401007/08/2017 79.86  >60.00 mL/min Final  . Microalb, Ur 02/19/2018 <0.7  0.0 - 1.9 mg/dL Final  . Creatinine,U 27/25/366407/08/2017 70.9  mg/dL Final  . Microalb Creat Ratio 02/19/2018 1.0  0.0 - 30.0 mg/g Final  . Hgb A1c MFr Bld 02/19/2018 8.6* 4.6 - 6.5 % Final   Glycemic Control Guidelines for People with Diabetes:Non Diabetic:  <6%Goal of Therapy: <7%Additional Action Suggested:  >8%     Allergies as of  02/26/2018      Reactions   Codeine Itching      Medication List        Accurate as of  02/26/18 10:33 AM. Always use your most recent med list.          acetaminophen 325 MG tablet Commonly known as:  TYLENOL Take 325-650 mg by mouth every 6 (six) hours as needed for moderate pain or headache.   aspirin-acetaminophen-caffeine 250-250-65 MG tablet Commonly known as:  EXCEDRIN MIGRAINE Take 1 tablet by mouth every 8 (eight) hours as needed for headache.   atenolol 25 MG tablet Commonly known as:  TENORMIN Take 50 mg by mouth at bedtime.   atorvastatin 20 MG tablet Commonly known as:  LIPITOR Take 20 mg by mouth at bedtime.   citalopram 40 MG tablet Commonly known as:  CELEXA Take 40 mg by mouth at bedtime.   glucagon 1 MG Solr injection Commonly known as:  GLUCAGEN HYPOKIT Inject 1 mg into the vein once as needed for low blood sugar.   HYDROcodone-acetaminophen 5-325 MG tablet Commonly known as:  NORCO/VICODIN Take 1 tablet by mouth every 4 (four) hours as needed for moderate pain.   ibuprofen 200 MG tablet Commonly known as:  ADVIL,MOTRIN Take 400 mg by mouth every 6 (six) hours as needed for headache or moderate pain.   insulin lispro 100 UNIT/ML injection Commonly known as:  HUMALOG USE UP TO 100 UNITS DAILY WITH  INSULIN  PUMP. DX CODE E10.65.   levETIRAcetam 1000 MG tablet Commonly known as:  KEPPRA Take 1,000 mg by mouth 2 (two) times daily.   methocarbamol 500 MG tablet Commonly known as:  ROBAXIN Take 1 tablet (500 mg total) by mouth every 8 (eight) hours as needed for muscle spasms.   naproxen 500 MG tablet Commonly known as:  NAPROSYN Take 1 tablet (500 mg total) by mouth 2 (two) times daily with a meal.   omeprazole 20 MG capsule Commonly known as:  PRILOSEC Take 40 mg by mouth at bedtime.       Allergies:  Allergies  Allergen Reactions  . Codeine Itching    Past Medical History:  Diagnosis Date  . Arthritis    knees, shoulder  .  Diabetes mellitus without complication (HCC)    Type II per Dr Lucianne Muss  . GERD (gastroesophageal reflux disease)   . High cholesterol   . History of gout   . History of kidney stones    several  . IBS (irritable bowel syndrome)   . 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  [name] TBI (traumatic brain injury) [facility])     Past Surgical History:  Procedure Laterality Date  . HAND SURGERY Right    tendon repair- thumb right hand.  [name] SHOULDER ARTHROSCOPY  08/03/2017   Procedure: RIGHT SHOULDER  DIAGNOSTIC ARTHROSCOPY;  Surgeon: Vickki Hearing, MD;  Location: [facility];  Service: Orthopedics;;  . SHOULDER ARTHROSCOPY WITH LABRAL REPAIR Right 08/29/2017   Procedure: RIGHT SHOULDER ARTHROSCOPY WITH DEBRIDEMENT, LABRAL REPAIR, BICEPS TENODESIS;  Surgeon: Cammy Copa, MD;  Location: [facility];  Service: Orthopedics;  Laterality: Right;    Family History  Problem Relation Age of Onset  . Diabetes Mother   . Diabetes Paternal Grandmother   . Diabetes Paternal Grandfather     Social History:  reports that he has been smoking cigarettes.  He has a 22.50 pack-year smoking history. He has never used smokeless tobacco. He reports that he does not drink alcohol or use drugs.    Review of Systems       Lipids:  He is now on Lipitor 20 mg, this is keeping his LDL below 100 and is prescribed by his PCP, not clear why this was changed from Crestor      Lab Results  Component Value Date   CHOL 139 11/27/2017   HDL 34 (A) 11/27/2017   LDLCALC 89 11/27/2017   LDLDIRECT 148.0 03/26/2015   TRIG 80 11/27/2017   CHOLHDL 4 06/27/2017      He takes Keppra for seizure prophylaxis    Physical Examination:  BP 130/70 (BP Location: Left Arm, Patient Position: Sitting, Cuff Size: Normal)   Pulse 78   Ht 5\' 4"  (1.626 m)   Wt 174 lb 3.2 oz (79 kg)   SpO2 98%   BMI 29.90 kg/m        ASSESSMENT/PLAN:   Diabetes,  insulin-dependent   See history of present illness for review of his current blood sugar patterns, problems identified  and pump  Management  A1c appears to be persistent around 8% and now 8.6  Currently is not able to get CGM or the 670 pump because of cost and insurance issues   He continues to have been frequent blood sugar monitoring and not able to get any consistent patterns However tends to have more hypoglycemia generally around 7-9 PM with or without increased activity preceding this He is also inappropriately treating low blood sugars with food and not simple sugars  Day-to-day management of his pump, mealtime insulin, prevention of and treatment of hypoglycemia and hyperglycemia were reviewed in detail   He can try to suspend the pump when his blood sugar gets low until glucose is back up to normal  Use simple sugars like juice followed by some carbohydrates for low sugars  More frequent checking and needs to take at least 3-4 readings a day regularly including some after meals  Call if blood sugars are consistently high after lunch when he tends to be higher  Call if blood sugars are also consistently high fasting  Reduce basal rate to 1.3 between 4 PM and 8:30 PM  We will try to get prior authorization for the freestyle libre sensor   Reather Littler 02/26/2018, 10:33 AM   Note: This office note was prepared with Dragon voice recognition system technology. Any transcriptional errors that result from this process are unintentional.

## 2018-02-26 ENCOUNTER — Encounter: Payer: Self-pay | Admitting: Endocrinology

## 2018-02-26 ENCOUNTER — Ambulatory Visit (INDEPENDENT_AMBULATORY_CARE_PROVIDER_SITE_OTHER): Payer: Managed Care, Other (non HMO) | Admitting: Endocrinology

## 2018-02-26 VITALS — BP 130/70 | HR 78 | Ht 64.0 in | Wt 174.2 lb

## 2018-02-26 DIAGNOSIS — E1065 Type 1 diabetes mellitus with hyperglycemia: Secondary | ICD-10-CM | POA: Diagnosis not present

## 2018-02-26 DIAGNOSIS — E7800 Pure hypercholesterolemia, unspecified: Secondary | ICD-10-CM | POA: Diagnosis not present

## 2018-02-26 NOTE — Patient Instructions (Signed)
Treat lows with juice  6p-8.30 pm is 1.3 basal

## 2018-02-28 ENCOUNTER — Other Ambulatory Visit: Payer: Self-pay

## 2018-02-28 MED ORDER — FREESTYLE LIBRE 14 DAY READER DEVI: 1.0000 | Freq: Every day | 0 refills | Status: DC

## 2018-02-28 MED ORDER — FREESTYLE LIBRE 14 DAY SENSOR MISC: 1.0000 | Freq: Every day | 3 refills | Status: DC

## 2018-04-20 ENCOUNTER — Other Ambulatory Visit: Payer: Self-pay | Admitting: Endocrinology

## 2018-05-21 ENCOUNTER — Other Ambulatory Visit (HOSPITAL_BASED_OUTPATIENT_CLINIC_OR_DEPARTMENT_OTHER): Payer: Self-pay

## 2018-05-21 DIAGNOSIS — G4733 Obstructive sleep apnea (adult) (pediatric): Secondary | ICD-10-CM

## 2018-05-30 ENCOUNTER — Ambulatory Visit: Payer: Managed Care, Other (non HMO) | Admitting: Neurology

## 2018-05-30 ENCOUNTER — Encounter: Payer: Self-pay | Admitting: Endocrinology

## 2018-05-30 ENCOUNTER — Ambulatory Visit (INDEPENDENT_AMBULATORY_CARE_PROVIDER_SITE_OTHER): Payer: Managed Care, Other (non HMO) | Admitting: Endocrinology

## 2018-05-30 VITALS — BP 134/82 | HR 75 | Wt 172.0 lb

## 2018-05-30 DIAGNOSIS — Z23 Encounter for immunization: Secondary | ICD-10-CM

## 2018-05-30 DIAGNOSIS — E1065 Type 1 diabetes mellitus with hyperglycemia: Secondary | ICD-10-CM | POA: Diagnosis not present

## 2018-05-30 LAB — POCT GLYCOSYLATED HEMOGLOBIN (HGB A1C): HEMOGLOBIN A1C: 8.4 % — AB (ref 4.0–5.6)

## 2018-05-30 NOTE — Progress Notes (Addendum)
Patient ID: Alex Tucker, male   DOB: Apr 07, 1971, 47 y.o.   MRN: [id number]    Reason for Appointment: F/u for Type 2 Diabetes   History of Present Illness:          Diagnosis: Type 1 diabetes mellitus, date of diagnosis:  2014      Past history:  He apparently had no symptoms at diagnosis and his blood sugar a year before at his physical was normal Details of his initial diagnosis are not available but he thinks his blood sugars were about 180 at that time with A1c around 9% He was initially tried on metformin but he could not tolerate this because of diarrhea Subsequently on his own he was trying to lose weight with diet and apparently lost 40 pounds around the end of 2014 His PCP started him on insulin in 12/14 approximately when his A1c was high and he was also losing weight. His initial consultation he was told to try Invokana and Victoza along with his basal insulin but he could not get Victoza because of non-coverage by his insurance.  Also he was having side effects of not feeling well and having some nausea with Invokana and stopped this in 8/15 after Also his Levemir was changed to Lantus insulin once a day; initially was started on 20 units   Recent history:   He  Has been on a Medtronic  Pump since 08/29/14 because of poor control with basal bolus insulin regimens;  prior to going on the pump A1c was over 10%  PUMP settings:Basal rate at midnight = 1.0, 5 AM = 1.2, 7 AM = 1.45 10 AM = 1.15, 1 PM = 1.25, 4 PM = 1.5 , 8:30 PM = 1.6  Carbohydrate coverage 1:7 until 8 AM, 8 PM-2 PM noon = 5.0, and 1: 4 dinner Sensitivity 1:30 at midnight , 1: 20) 2 PM and then 1:20,target 120 Active insulin 4 hours  A1c is now 8.6, previously 8.1  Current management, blood sugar patterns and problems identified are as follows:   He has very significant variability in blood sugars as discussed in the interpretation below  He says his mealtime coverage is variable with unpredictable  postprandial readings despite eating the same food sometimes  However he does have significant postprandial spikes even from not taking enough insulin or occasionally missed boluses  Overall blood sugars are high through the night unless he is making a correction for high reading  Because of his work schedule and increased activity he has difficulty keeping his blood sugars even while working  He does appear to have overcorrection of high readings fairly often which is usually the reason for getting low blood sugars including overnight  He is treating low blood sugars either with juice or peanut butter crackers and he does not use glucose tablets consistently, however usually has a significant rebound after low sugars  Currently is overriding the pump about 45% of the time   CONTINUOUS GLUCOSE MONITORING RECORD INTERPRETATION    Dates of Recording: 9/26 through 05/30/2018  Sensor description: [employer]  Results statistics:   CGM use % of time  92  Average and SD  223+/-99.5  Time in range       28 %  % Time Above 180  66  % Time above 250  38  % Time Below target  6    Glycemic patterns summary: Blood sugars are averaging over 200 throughout the day except between 4-6 PM  and fluctuating very significantly at all times  Hyperglycemic episodes occur randomly at various times but periodically after lunch or evening meal.  Also has significant rebound from low blood sugars  Hypoglycemic episodes occurred recently either overnight, late afternoon or evening but usually preceded by hyperglycemia  Overnight periods: Blood sugars are significantly high throughout the night but on occasion will come down significantly to low levels such as in the last 2 nights following correction of high readings  Preprandial periods: Blood sugars are averaging over 200 at all mealtimes with considerable variability especially at dinnertime   Postprandial periods: After breakfast: Blood sugars  are usually not rising excessively unusually flat    After lunch: Blood sugars may spike occasionally to readings as high as 350 on about one third of the days After dinner: Blood sugars are variable with some blood sugar spikes after supper, sometimes after rebound from low sugars but no consistent trend   PRE-MEAL Fasting Lunch Dinner Bedtime Overall  Glucose range:       Mean/median:  234   187  275    POST-MEAL PC Breakfast PC Lunch PC Dinner  Glucose range:     Mean/median:  214  224  225    Previous readings  PRE-MEAL Fasting Lunch Dinner Bedtime Overall  Glucose range:  56-364  102-300  51-279  36-347   Mean/median:     152   POST-MEAL PC Breakfast PC Lunch PC Dinner  Glucose range:     Mean/median:   197     Side effects from medications have been: Metformin causes diarrhea, Invokana causes nausea and malaise   Glycemic control:   Lab Results  Component Value Date   HGBA1C 8.6 (H) 02/19/2018   HGBA1C 8.1 11/27/2017   HGBA1C 8.0 (H) 08/29/2017   Lab Results  Component Value Date   MICROALBUR <0.7 02/19/2018   LDLCALC 89 11/27/2017   CREATININE 1.06 02/19/2018    Self-care: The diet that the patient has been following QM:VHQIONG low fat  Meals: 3 meals per day. 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  Latest known visit with results is:  Lab on 02/19/2018  Component Date Value Ref Range Status  . Sodium 02/19/2018 138  135 - 145 mEq/L Final  . Potassium 02/19/2018 4.1  3.5 - 5.1 mEq/L Final  . Chloride 02/19/2018 104   96 - 112 mEq/L Final  . CO2 02/19/2018 29  19 - 32 mEq/L Final  . Glucose, Bld 02/19/2018 128* 70 - 99 mg/dL Final  . BUN 29/52/8413 16  6 - 23 mg/dL Final  . Creatinine, Ser 02/19/2018 1.06  0.40 - 1.50 mg/dL Final  . Calcium 24/40/1027 9.1  8.4 - 10.5 mg/dL Final  . GFR 25/36/6440 79.86  >60.00 mL/min Final  . Microalb, Ur 02/19/2018 <0.7  0.0 - 1.9 mg/dL Final  . Creatinine,U  02/19/2018 70.9  mg/dL Final  . Microalb Creat Ratio 02/19/2018 1.0  0.0 - 30.0 mg/g Final  . Hgb A1c MFr Bld 02/19/2018 8.6* 4.6 - 6.5 % Final   Glycemic Control Guidelines for People with Diabetes:Non Diabetic:  <6%Goal of Therapy: <7%Additional Action Suggested:  >8%     Allergies as of 05/30/2018      Reactions   Codeine Itching      Medication List        Accurate as of 05/30/18 10:50 AM. Always use your most recent med list.          acetaminophen 325 MG tablet Commonly known as:  TYLENOL Take 325-650 mg by mouth every 6 (six) hours as needed for moderate pain or headache.   aspirin-acetaminophen-caffeine 250-250-65 MG tablet Commonly known as:  EXCEDRIN MIGRAINE Take 1 tablet by mouth every 8 (eight) hours as needed for headache.   atenolol 25 MG tablet Commonly known as:  TENORMIN Take 50 mg by mouth at bedtime.   atorvastatin 20 MG tablet Commonly known as:  LIPITOR Take 20 mg by mouth at bedtime.   citalopram 40 MG tablet Commonly known as:  CELEXA Take 40 mg by mouth at bedtime.   FREESTYLE LIBRE 14 DAY READER Devi 1 each by Does not apply route daily. USE WITH SENSOR TO CHECK BLOOD SUGAR DAILY.   FREESTYLE LIBRE 14 DAY SENSOR Misc 1 each by Does not apply route daily. APPLY ONE SENSOR TO ARM EVERY 14 DAYS TO MONITOR BLOOD SUGAR.   glucagon 1 MG Solr injection Commonly known as:  GLUCAGEN Inject 1 mg into the vein once as needed for low blood sugar.   HYDROcodone-acetaminophen 5-325 MG tablet Commonly known as:  NORCO/VICODIN Take 1 tablet by mouth every 4 (four) hours as  needed for moderate pain.   ibuprofen 200 MG tablet Commonly known as:  ADVIL,MOTRIN Take 400 mg by mouth every 6 (six) hours as needed for headache or moderate pain.   insulin lispro 100 UNIT/ML injection Commonly known as:  HUMALOG USE UP TO 100 UNITS DAILY WITH  INSULIN  PUMP. DX CODE E10.65.   levETIRAcetam 1000 MG tablet Commonly known as:  KEPPRA Take 1,000 mg by mouth 2 (two) times daily.   methocarbamol 500 MG tablet Commonly known as:  ROBAXIN Take 1 tablet (500 mg total) by mouth every 8 (eight) hours as needed for muscle spasms.   naproxen 500 MG tablet Commonly known as:  NAPROSYN Take 1 tablet (500 mg total) by mouth 2 (two) times daily with a meal.   omeprazole 20 MG capsule Commonly known as:  PRILOSEC Take 40 mg by mouth at bedtime.       Allergies:  Allergies  Allergen Reactions  . Codeine Itching    Past Medical History:  Diagnosis Date  . Arthritis    knees, shoulder  . Diabetes mellitus without complication (HCC)    Type II per Dr Lucianne Muss  . GERD (gastroesophageal reflux disease)   . High cholesterol   . History of gout   . History of kidney stones    several  . IBS (irritable bowel syndrome)   . 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  [name] TBI (traumatic brain injury) [facility])     Past Surgical History:  Procedure Laterality Date  . HAND SURGERY Right    tendon repair- thumb right hand.  [name] SHOULDER ARTHROSCOPY  08/03/2017  Procedure: RIGHT SHOULDER  DIAGNOSTIC ARTHROSCOPY;  Surgeon: Vickki Hearing, MD;  Location: [facility];  Service: Orthopedics;;  . SHOULDER ARTHROSCOPY WITH LABRAL REPAIR Right 08/29/2017   Procedure: RIGHT SHOULDER ARTHROSCOPY WITH DEBRIDEMENT, LABRAL REPAIR, BICEPS TENODESIS;  Surgeon: Cammy Copa, MD;  Location: [facility];  Service: Orthopedics;  Laterality: Right;    Family History  Problem Relation Age of Onset  . Diabetes Mother   . Diabetes  Paternal Grandmother   . Diabetes Paternal Grandfather     Social History:  reports that he has been smoking cigarettes. He has a 22.50 pack-year smoking history. He has never used smokeless tobacco. He reports that he does not drink alcohol or use drugs.    Review of Systems       Lipids:    He is now on Lipitor 20 mg, this is keeping his LDL below 100 and is prescribed by his PCP, not clear why this was changed from Crestor      Lab Results  Component Value Date   CHOL 139 11/27/2017   HDL 34 (A) 11/27/2017   LDLCALC 89 11/27/2017   LDLDIRECT 148.0 03/26/2015   TRIG 80 11/27/2017   CHOLHDL 4 06/27/2017      He takes Keppra for seizure prophylaxis    Physical Examination:  BP 134/82   Pulse 75   Wt 172 lb (78 kg)   SpO2 96%   BMI 29.52 kg/m       ASSESSMENT/PLAN:   Diabetes, insulin-dependent   See history of present illness for review of his current blood sugar patterns, problems identified  and pump  Management  A1c appears to be persistent above 8% and now 8.4  Currently is using the freestyle libre sensor  Blood sugars are extremely variable at all times He has difficulty getting adequate control of postprandial readings frequently and this may be either related to inadequate boluses, not taking the full bolus amount because of fear of hypoglycemia while working or delayed or missed boluses  Hypoglycemia is mostly related to overcorrection of high readings  Day-to-day management of his pump, mealtime insulin, prevention of and treatment of hypoglycemia and hyperglycemia were reviewed in detail   He does need to enter his blood sugar at the time of any mealtime bolus  He will need to try and bolus at least 10 to 15 minutes before eating as much as possible  Avoid overtreating low blood sugars and use simple sugars to initially increase blood sugar back to normal and then have a snack  Reduce correction factor by factor of 5 throughout the day and more  if needed  Avoid over riding the pump settings  Consider increasing carbohydrate ratio for lunch bolus since he may be more often getting high readings after lunch  Will hopefully get coverage for the 670 pump approved by Medicare and he can benefit from this  Continue using temporary basal when blood sugars are trending downwards and he is getting more active  Counseling time on subjects discussed in assessment and plan sections is over 50% of today's 25 minute visit  Influenza vaccine given  Reather Littler 05/30/2018, 10:50 AM   Note: This office note was prepared with Dragon voice recognition system technology. Any transcriptional errors that result from this process are unintentional.

## 2018-05-30 NOTE — Patient Instructions (Addendum)
Enter sugar at time of meal bolus

## 2018-06-06 NOTE — Procedures (Unsigned)
   [facility] Keneisha Heckart A. Gerilyn Pilgrim, MD     [email or web address]             NOCTURNAL POLYSOMNOGRAPHY   LOCATION: [facility]   INTEREPRETAION IN PROGRESS DUE DATA ERROR  Argie Ramming, MD Diplomate, American Board of Sleep Medicine.  ELECTRONICALLY SIGNED ON:  06/06/2018, 9:59 AM [facility] PH: [phone]   FX: [phone] ACCREDITED BY THE AMERICAN ACADEMY OF SLEEP MEDICINE

## 2018-07-08 ENCOUNTER — Ambulatory Visit: Payer: Managed Care, Other (non HMO) | Attending: Neurology | Admitting: Neurology

## 2018-07-08 DIAGNOSIS — G4733 Obstructive sleep apnea (adult) (pediatric): Secondary | ICD-10-CM

## 2018-07-11 NOTE — Procedures (Signed)
[facility] Ymani Tucker A. Gerilyn Pilgrimoonquah, MD     [email or web address]             NOCTURNAL POLYSOMNOGRAPHY   LOCATION: [facility]   Patient Name: Alex Tucker, Alex Study Date: 07/08/2018 Gender: Male D.O.B: 22-Jan-1971 Age (years): [age] Referring Provider: Beryle BeamsKofi Audi Wettstein MD, ABSM Height (inches): 64 Interpreting Physician: Beryle BeamsKofi Hadas Jessop MD, ABSM Weight (lbs): 174 RPSGT: Alex EllisHedrick, Debra BMI: 30 MRN: [id number] Neck Size: 16.50 CLINICAL INFORMATION Sleep Study Type: NPSG     Indication for sleep study: N/A     Epworth Sleepiness Score: 14      Most recent polysomnogram dated 05/30/2018 revealed an AHI of 5.5/h and RDI of 5.5/h. SLEEP STUDY TECHNIQUE As per the AASM Manual for the Scoring of Sleep and Associated Events v2.3 (April 2016) with a hypopnea requiring 4% desaturations.  The channels recorded and monitored were frontal, central and occipital EEG, electrooculogram (EOG), submentalis EMG (chin), nasal and oral airflow, thoracic and abdominal wall motion, anterior tibialis EMG, snore microphone, electrocardiogram, and pulse oximetry.  MEDICATIONS Medications self-administered by patient taken the night of the study : N/A  Current Outpatient Medications:  .  acetaminophen (TYLENOL) 325 MG tablet, Take 325-650 mg by mouth every 6 (six) hours as needed for moderate pain or headache. , Disp: , Rfl:  .  aspirin-acetaminophen-caffeine (EXCEDRIN MIGRAINE) 250-250-65 MG tablet, Take 1 tablet by mouth every 8 (eight) hours as needed for headache., Disp: , Rfl:  .  atenolol (TENORMIN) 25 MG tablet, Take 50 mg by mouth at bedtime. , Disp: , Rfl:  .  atorvastatin (LIPITOR) 20 MG tablet, Take 20 mg by mouth at bedtime., Disp: , Rfl:  .  citalopram (CELEXA) 40 MG tablet, Take 40 mg by mouth at bedtime. , Disp: , Rfl:  .  Continuous Blood Gluc Receiver (FREESTYLE LIBRE 14 DAY READER) DEVI, 1 each by Does not apply route daily. USE WITH SENSOR TO CHECK BLOOD SUGAR DAILY., Disp: 1 Device,  Rfl: 0 .  Continuous Blood Gluc Sensor (FREESTYLE LIBRE 14 DAY SENSOR) MISC, 1 each by Does not apply route daily. APPLY ONE SENSOR TO ARM EVERY 14 DAYS TO MONITOR BLOOD SUGAR., Disp: 2 each, Rfl: 3 .  glucagon (GLUCAGEN HYPOKIT) 1 MG SOLR injection, Inject 1 mg into the vein once as needed for low blood sugar., Disp: 1 each, Rfl: 5 .  HYDROcodone-acetaminophen (NORCO/VICODIN) 5-325 MG tablet, Take 1 tablet by mouth every 4 (four) hours as needed for moderate pain., Disp: 30 tablet, Rfl: 0 .  ibuprofen (ADVIL,MOTRIN) 200 MG tablet, Take 400 mg by mouth every 6 (six) hours as needed for headache or moderate pain., Disp: , Rfl:  .  insulin lispro (HUMALOG) 100 UNIT/ML injection, USE UP TO 100 UNITS DAILY WITH  INSULIN  PUMP. DX CODE E10.65., Disp: 40 mL, Rfl: 3 .  levETIRAcetam (KEPPRA) 1000 MG tablet, Take 1,000 mg by mouth 2 (two) times daily. , Disp: , Rfl:  .  methocarbamol (ROBAXIN) 500 MG tablet, Take 1 tablet (500 mg total) by mouth every 8 (eight) hours as needed for muscle spasms., Disp: 30 tablet, Rfl: 0 .  naproxen (NAPROSYN) 500 MG tablet, Take 1 tablet (500 mg total) by mouth 2 (two) times daily with a meal., Disp: 60 tablet, Rfl: 5 .  omeprazole (PRILOSEC) 20 MG capsule, Take 40 mg by mouth at bedtime. , Disp: , Rfl:      SLEEP ARCHITECTURE The study was initiated at 10:44:01 PM and ended at 5:09:34 AM.  Sleep onset time was  81.2 minutes and the sleep efficiency was 74.1%%. The total sleep time was 285.8 minutes.  Stage REM latency was 146.5 minutes.  The patient spent 4.2%% of the night in stage N1 sleep, 78.8%% in stage N2 sleep, 6.6%% in stage N3 and 10.3% in REM.  Alpha intrusion was absent.  Supine sleep was 64.20%.  RESPIRATORY PARAMETERS The overall apnea/hypopnea index (AHI) was 19.7 per hour. There were 25 total apneas, including 22 obstructive, 3 central and 0 mixed apneas. There were 69 hypopneas and 0 RERAs.  The AHI during Stage REM sleep was 2.0 per hour.  AHI  while supine was 29.1 per hour.  The mean oxygen saturation was 92.6%. The minimum SpO2 during sleep was 83.0%.  moderate snoring was noted during this study.  CARDIAC DATA The 2 lead EKG demonstrated sinus rhythm. The mean heart rate was 64.1 beats per minute. Other EKG findings include: None.  LEG MOVEMENT DATA The total PLMS were 0 with a resulting PLMS index of 0.0. Associated arousal with leg movement index was 0.0.  IMPRESSIONS 1. Moderate obstructive sleep apnea is documented with this study.  Argie Ramming, MD Diplomate, American Board of Sleep Medicine. ELECTRONICALLY SIGNED ON:  07/11/2018, 10:49 AM [facility] PH: [phone]   FX: [phone] ACCREDITED BY THE AMERICAN ACADEMY OF SLEEP MEDICINE

## 2018-07-23 ENCOUNTER — Other Ambulatory Visit: Payer: Self-pay | Admitting: Endocrinology

## 2018-07-23 NOTE — Telephone Encounter (Signed)
Pt is Dr. Remus BlakeKumar's patient, not Dr. George HughEllison's. Please review refill request.

## 2018-07-24 ENCOUNTER — Other Ambulatory Visit: Payer: Self-pay

## 2018-07-24 MED ORDER — INSULIN LISPRO 100 UNIT/ML ~~LOC~~ SOLN: SUBCUTANEOUS | 3 refills | Status: DC

## 2018-08-07 ENCOUNTER — Telehealth: Payer: Self-pay | Admitting: Endocrinology

## 2018-08-07 NOTE — Telephone Encounter (Signed)
Would Alex Tucker order this please advise

## 2018-08-07 NOTE — Telephone Encounter (Signed)
Patient has [employer] as primary and Medicare as secondary and wants to try to get this pushed through as soon as possible he spoke with [employer] and they stated all he needs is a prescription sent to them- he wants this done now because the warranty has run out on old pump and he starts with new insurance at the first of the year-please advise

## 2018-08-07 NOTE — Telephone Encounter (Signed)
If he is on a Medicare plan then he is still not eligible for the Medtronic 670.  He can call [employer] first

## 2018-08-07 NOTE — Telephone Encounter (Signed)
Patient called requesting an Rx for new insulin Pump.  Patient states that Dr Lucianne MussKumar has been adv sing him to get a new one and he is now calling to get one.    Call back number is [phone]

## 2018-08-07 NOTE — Telephone Encounter (Signed)
Please asked [employer] to send us a fax.  They will need me to sign medical necessity form

## 2018-08-08 NOTE — Telephone Encounter (Signed)
Spoke with Maralyn SagoSarah at [facility] and she stated patient can not start the process of upgrading until 08/22/18 when his warrantee runs out-patient informed of this

## 2018-08-19 IMAGING — MR MR SHOULDER*R* W/O CM
4 of 5 series · 19 of 40 positions shown · non-contrast
Comparison: Plain films right shoulder 05/26/2017.

CLINICAL DATA: Right shoulder pain for 3 years.  No known injury.

EXAM:
MRI OF THE RIGHT SHOULDER WITHOUT CONTRAST
TECHNIQUE: Multiplanar, multisequence MR imaging of the shoulder was performed.
No intravenous contrast was administered.

[Series 3: t2fs axial · axial · 3.0mm · 0.29mm/px · z∈[+6,+56]mm · 3 of 20 slices shown]
[im 3/20]
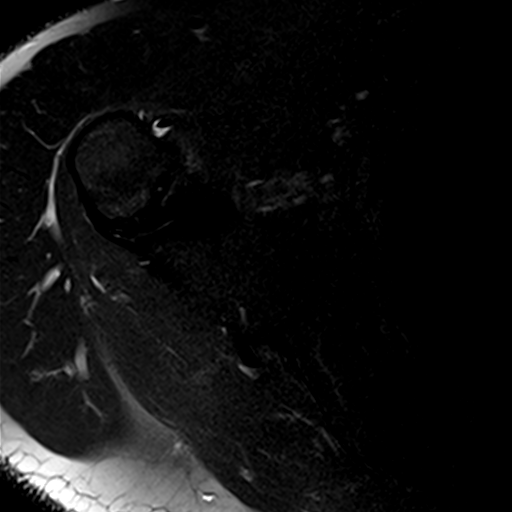
[im 11/20]
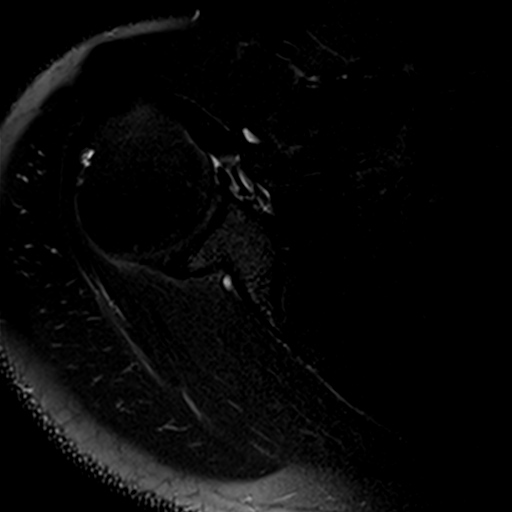
[im 17/20]
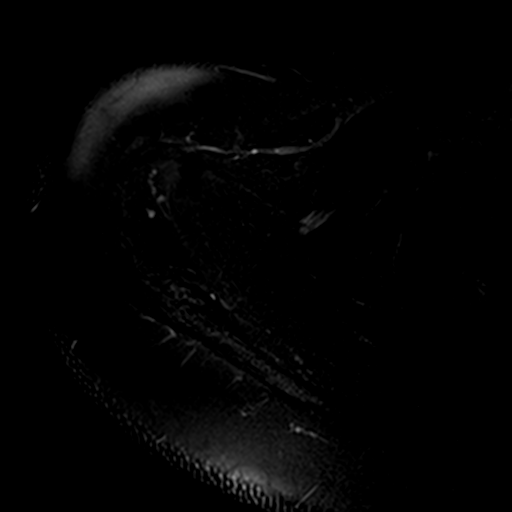

[Series 4: t2fs coronal · oblique · 3.0mm · 0.29mm/px · 3 of 20 slices shown]
[im 4/20]
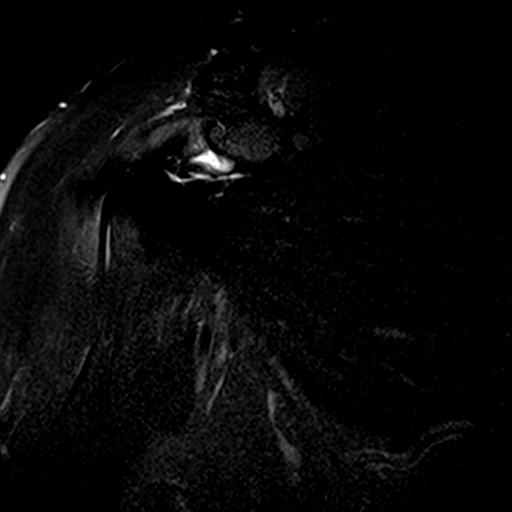
[im 10/20]
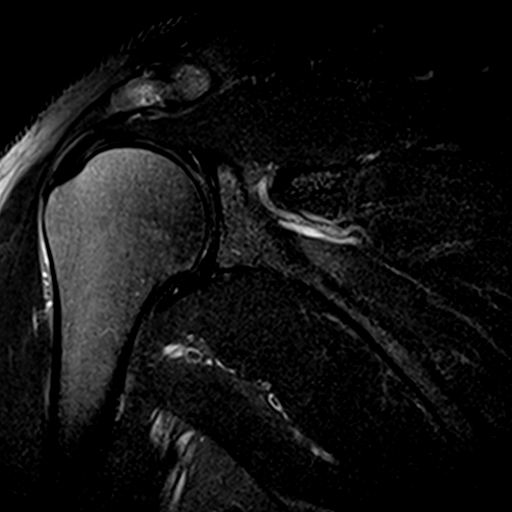
[im 16/20]
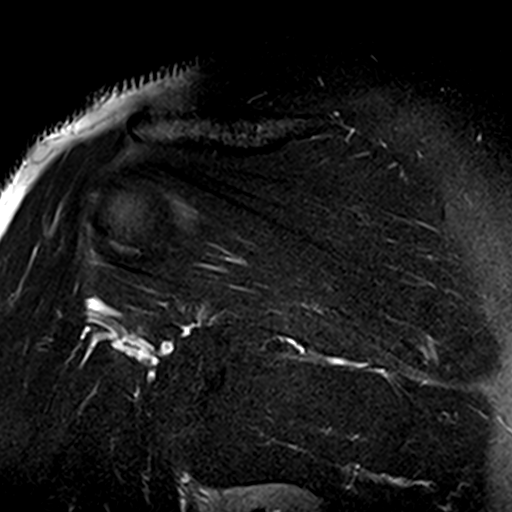

[Series 5: PD · oblique · 3.0mm · 0.29mm/px · 7 of 20 slices shown]
[im 1/20]
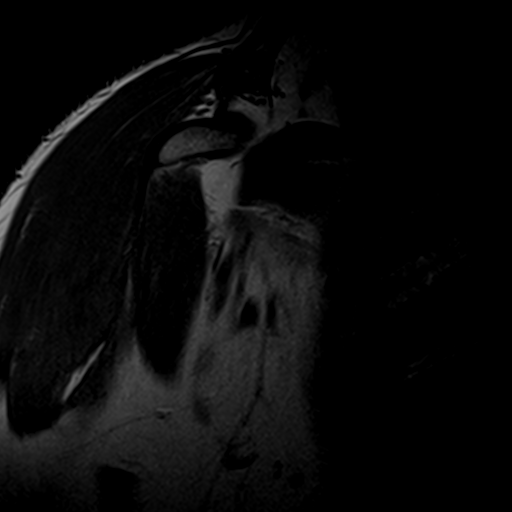
[im 4/20]
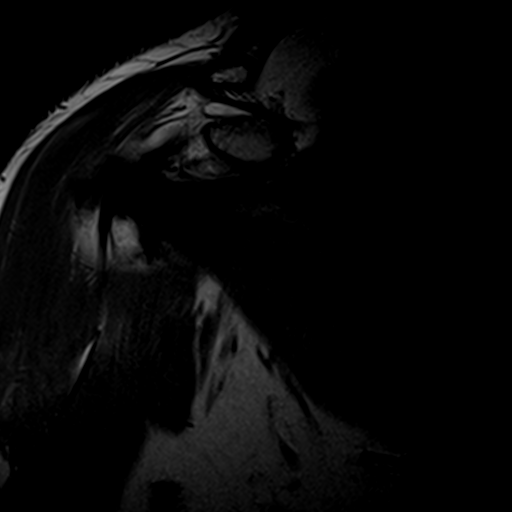
[im 7/20]
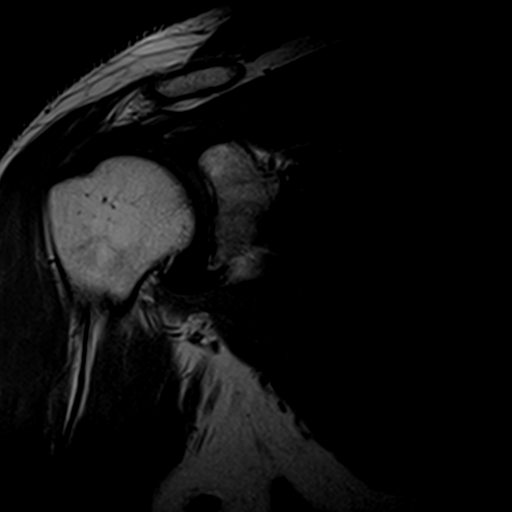
[im 10/20]
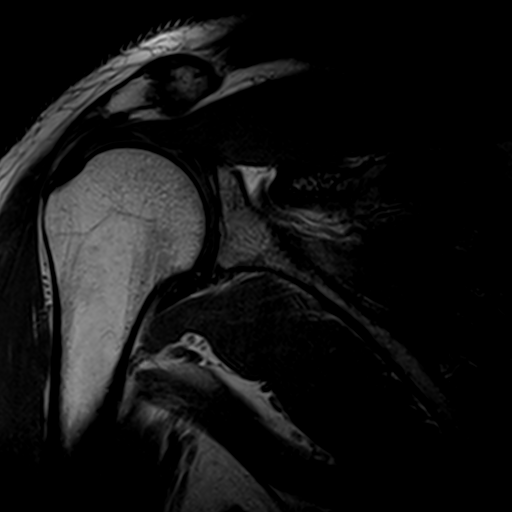
[im 13/20]
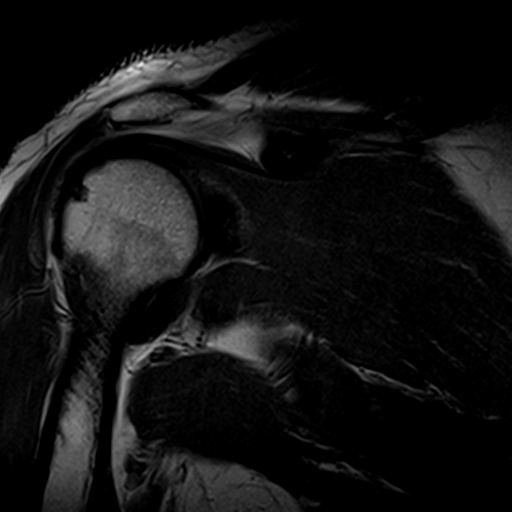
[im 16/20]
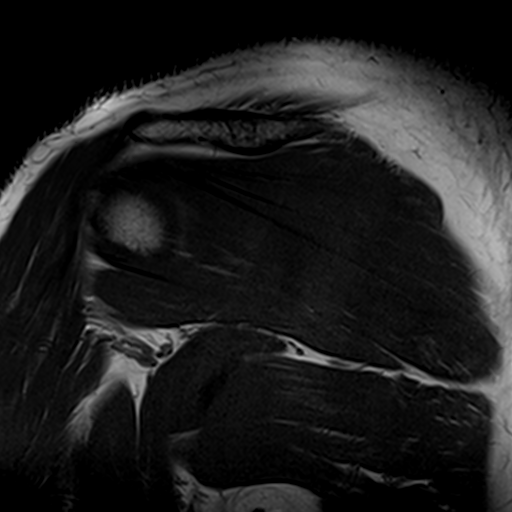
[im 20/20]
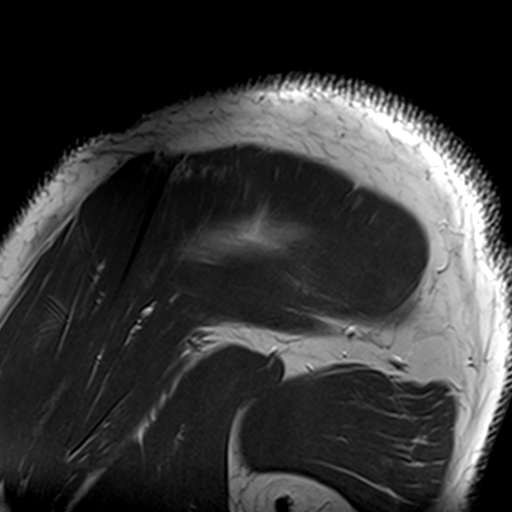

[Series 6: T1 · oblique · 3.0mm · 0.26mm/px · 6 of 24 slices shown]
[im 1/24]
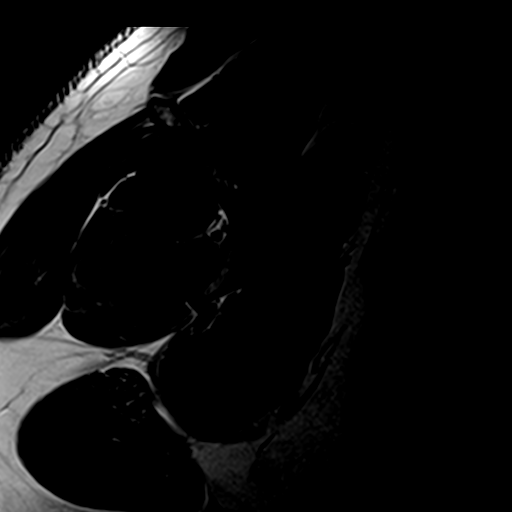
[im 3/24]
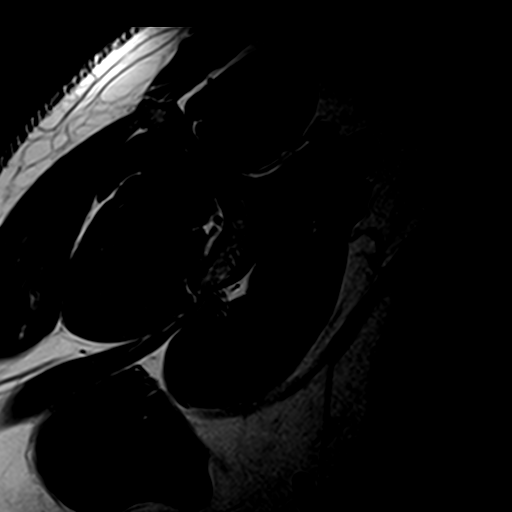
[im 6/24]
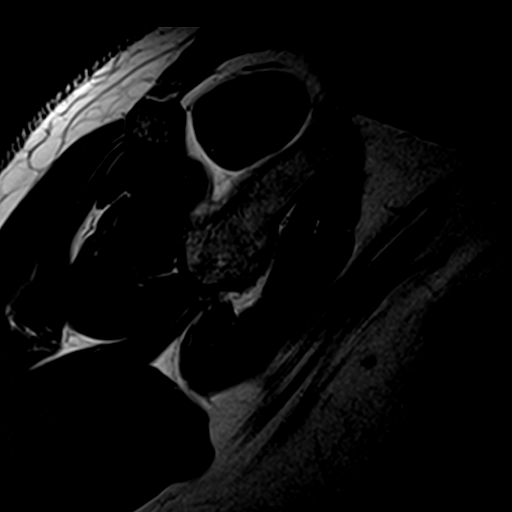
[im 9/24]
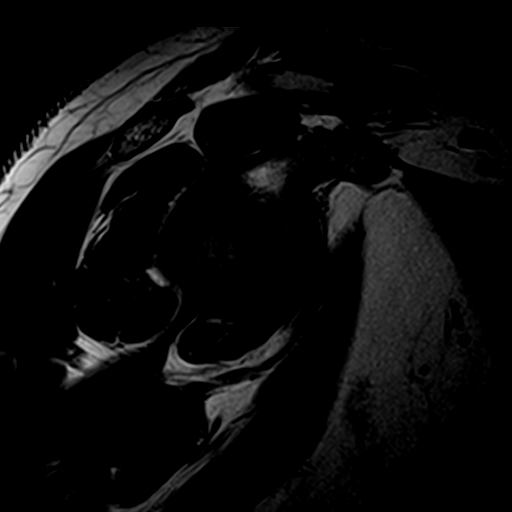
[im 12/24]
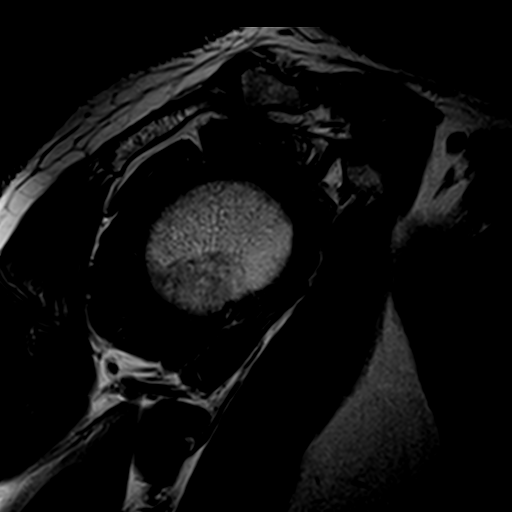
[im 21/24]
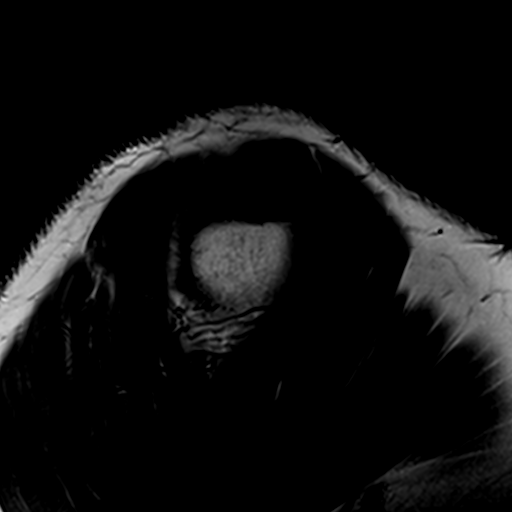

[19 of 40 positions shown; findings below may reference images not displayed]

FINDINGS: Rotator cuff:  Intact and normal in appearance.

Muscles:  Normal without atrophy or focal lesion.

Biceps long head:  Intact and normal in appearance.

Acromioclavicular Joint: Mildly degenerated. Type 1 acromion. No
subacromial/subdeltoid bursal fluid.

Glenohumeral Joint: Appears normal.

Labrum: Linear increased T2 signal is seen within the labrum from 11
o'clock to the 10 o'clock position superiorly which may be due to a
tear. Otherwise unremarkable.

Bones:  No fracture or worrisome lesion.

Other: None.
IMPRESSION: Linear increased T2 signal within the posterior aspect of the
superior labrum may be due to a small SLAP type 2 tear.

Intact and normal appearing rotator cuff and long head of biceps.

Mild acromioclavicular osteoarthritis.

## 2018-08-30 ENCOUNTER — Ambulatory Visit: Payer: Managed Care, Other (non HMO) | Admitting: Endocrinology

## 2018-09-04 ENCOUNTER — Telehealth: Payer: Self-pay | Admitting: Endocrinology

## 2018-09-04 NOTE — Telephone Encounter (Signed)
[employer] received medical necessities but they need dates when the A1C was drawn and the result. They also need the blood glucose value range and the checks per day. Patient history section.  Please Advise, thanks Fax # [phone]

## 2018-09-04 NOTE — Telephone Encounter (Signed)
Corrected and faxed back

## 2018-09-13 ENCOUNTER — Other Ambulatory Visit: Payer: Self-pay | Admitting: Endocrinology

## 2018-09-13 DIAGNOSIS — E7800 Pure hypercholesterolemia, unspecified: Secondary | ICD-10-CM

## 2018-09-13 DIAGNOSIS — E1065 Type 1 diabetes mellitus with hyperglycemia: Secondary | ICD-10-CM

## 2018-09-13 DIAGNOSIS — E782 Mixed hyperlipidemia: Secondary | ICD-10-CM

## 2018-09-13 LAB — LIPID PANEL
Cholesterol: 130 mg/dL (ref 0–200)
HDL: 30.3 mg/dL — ABNORMAL LOW (ref >39.00)
LDL Cholesterol: 67 mg/dL (ref 0–99)
NonHDL: 99.29
Total CHOL/HDL Ratio: 4
Triglycerides: 160 mg/dL — ABNORMAL HIGH (ref 0.0–149.0)
VLDL: 32 mg/dL (ref 0.0–40.0)

## 2018-09-13 LAB — COMPREHENSIVE METABOLIC PANEL
ALT: 22 U/L (ref 0–53)
AST: 19 U/L (ref 0–37)
Albumin: 3.8 g/dL (ref 3.5–5.2)
Alkaline Phosphatase: 87 U/L (ref 39–117)
BUN: 15 mg/dL (ref 6–23)
CO2: 30 mEq/L (ref 19–32)
CREATININE: 1.21 mg/dL (ref 0.40–1.50)
Calcium: 9.3 mg/dL (ref 8.4–10.5)
Chloride: 100 mEq/L (ref 96–112)
GFR: 64.33 mL/min (ref >60.00)
Glucose, Bld: 286 mg/dL — ABNORMAL HIGH (ref 70–99)
Potassium: 4.6 mEq/L (ref 3.5–5.1)
SODIUM: 135 meq/L (ref 135–145)
TOTAL PROTEIN: 6.9 g/dL (ref 6.0–8.3)
Total Bilirubin: 0.4 mg/dL (ref 0.2–1.2)

## 2018-09-13 LAB — HEMOGLOBIN A1C: Hgb A1c MFr Bld: 8.9 % — ABNORMAL HIGH (ref 4.6–6.5)

## 2018-09-19 ENCOUNTER — Encounter: Payer: Self-pay | Admitting: Endocrinology

## 2018-09-19 ENCOUNTER — Ambulatory Visit: Payer: Medicare Other | Admitting: Endocrinology

## 2018-09-19 DIAGNOSIS — E1065 Type 1 diabetes mellitus with hyperglycemia: Secondary | ICD-10-CM

## 2018-09-19 DIAGNOSIS — E7800 Pure hypercholesterolemia, unspecified: Secondary | ICD-10-CM
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
## 2018-10-15 ENCOUNTER — Telehealth: Payer: Self-pay | Admitting: Endocrinology

## 2018-10-15 NOTE — Telephone Encounter (Signed)
Tiffany with [facility] ph# [phone], ext [phone] called re: Request for Fasting C Peptide and fasting Blood Glucose, needed for the patient's insurance in order for patient to receive an insulin pump. Please call Tiffany at the ph# listed above to advise.

## 2018-10-15 NOTE — Telephone Encounter (Signed)
Pt needs to come in for C-Peptide, which he is aware. Once this is done, the paperwork and labs will be sent to [facility].

## 2018-10-19 ENCOUNTER — Other Ambulatory Visit: Payer: Medicare Other

## 2018-10-19 ENCOUNTER — Other Ambulatory Visit: Payer: Self-pay

## 2018-10-19 DIAGNOSIS — E1065 Type 1 diabetes mellitus with hyperglycemia: Secondary | ICD-10-CM

## 2018-10-19 LAB — GLUCOSE, RANDOM: GLUCOSE: 226 mg/dL — AB (ref 70–99)

## 2018-10-19 NOTE — Progress Notes (Signed)
.  cpep

## 2018-10-20 LAB — C-PEPTIDE: C-Peptide: <0.1 ng/mL — ABNORMAL LOW (ref 1.1–4.4)

## 2018-10-24 ENCOUNTER — Telehealth: Payer: Self-pay | Admitting: Endocrinology

## 2018-10-24 NOTE — Telephone Encounter (Signed)
Labs will be printed and faxed

## 2018-10-24 NOTE — Telephone Encounter (Signed)
[facility] called and they need the patients most recent glucose result sent to them via fax.  The last labs were done 10/19/2018 and they got all of them but glucose

## 2018-10-30 ENCOUNTER — Telehealth: Payer: Self-pay | Admitting: Endocrinology

## 2018-10-30 NOTE — Telephone Encounter (Signed)
[facility] ph# [phone], ext [phone] called re: request of recent labs-fasting blood glucose (10/19/18). This is second request-holding up patient's supplies If able to fax - fax# [phone]. Reference# [id number]

## 2018-10-30 NOTE — Telephone Encounter (Signed)
This paperwork is with the MD and is done in the order in which it is received. Alex Tucker has been notified of this multiple times.

## 2018-11-07 ENCOUNTER — Emergency Department (HOSPITAL_COMMUNITY)
Admission: EM | Admit: 2018-11-07 | Discharge: 2018-11-07 | Disposition: A | Discharge status: Home / Self Care | Payer: Medicare Other | Attending: Emergency Medicine | Admitting: Emergency Medicine

## 2018-11-07 ENCOUNTER — Other Ambulatory Visit: Payer: Self-pay

## 2018-11-07 ENCOUNTER — Telehealth: Payer: Self-pay | Admitting: Endocrinology

## 2018-11-07 ENCOUNTER — Encounter (HOSPITAL_COMMUNITY): Payer: Self-pay

## 2018-11-07 ENCOUNTER — Emergency Department (HOSPITAL_COMMUNITY): Payer: Medicare Other

## 2018-11-07 DIAGNOSIS — F17210 Nicotine dependence, cigarettes, uncomplicated: Secondary | ICD-10-CM | POA: Diagnosis not present

## 2018-11-07 DIAGNOSIS — E162 Hypoglycemia, unspecified: Secondary | ICD-10-CM

## 2018-11-07 DIAGNOSIS — Z9641 Presence of insulin pump (external) (internal): Secondary | ICD-10-CM | POA: Insufficient documentation

## 2018-11-07 DIAGNOSIS — R4182 Altered mental status, unspecified: Secondary | ICD-10-CM | POA: Diagnosis not present

## 2018-11-07 DIAGNOSIS — E11649 Type 2 diabetes mellitus with hypoglycemia without coma: Secondary | ICD-10-CM | POA: Diagnosis not present

## 2018-11-07 DIAGNOSIS — Z79899 Other long term (current) drug therapy: Secondary | ICD-10-CM | POA: Diagnosis not present

## 2018-11-07 DIAGNOSIS — Z794 Long term (current) use of insulin: Secondary | ICD-10-CM | POA: Diagnosis not present

## 2018-11-07 DIAGNOSIS — W19XXXA Unspecified fall, initial encounter: Secondary | ICD-10-CM

## 2018-11-07 LAB — CBC WITH DIFFERENTIAL/PLATELET
ABS IMMATURE GRANULOCYTES: 0.05 10*3/uL (ref 0.00–0.07)
Basophils Absolute: 0.1 10*3/uL (ref 0.0–0.1)
Basophils Relative: 1 %
Eosinophils Absolute: 0.1 10*3/uL (ref 0.0–0.5)
Eosinophils Relative: 1 %
HCT: 41.1 % (ref 39.0–52.0)
Hemoglobin: 13.8 g/dL (ref 13.0–17.0)
Immature Granulocytes: 0 %
Lymphocytes Relative: 9 %
Lymphs Abs: 1.4 10*3/uL (ref 0.7–4.0)
MCH: 30.1 pg (ref 26.0–34.0)
MCHC: 33.6 g/dL (ref 30.0–36.0)
MCV: 89.5 fL (ref 80.0–100.0)
MONO ABS: 1.3 10*3/uL — AB (ref 0.1–1.0)
Monocytes Relative: 8 %
Neutro Abs: 12.9 10*3/uL — ABNORMAL HIGH (ref 1.7–7.7)
Neutrophils Relative %: 81 %
Platelets: 325 10*3/uL (ref 150–400)
RBC: 4.59 MIL/uL (ref 4.22–5.81)
RDW: 12.4 % (ref 11.5–15.5)
WBC: 15.9 10*3/uL — ABNORMAL HIGH (ref 4.0–10.5)
nRBC: 0 % (ref 0.0–0.2)

## 2018-11-07 LAB — TROPONIN I: Troponin I: <0.03 ng/mL (ref <0.03)

## 2018-11-07 LAB — COMPREHENSIVE METABOLIC PANEL
ALT: 28 U/L (ref 0–44)
AST: 25 U/L (ref 15–41)
Albumin: 3.8 g/dL (ref 3.5–5.0)
Alkaline Phosphatase: 82 U/L (ref 38–126)
Anion gap: 9 (ref 5–15)
BUN: 16 mg/dL (ref 6–20)
CHLORIDE: 101 mmol/L (ref 98–111)
CO2: 24 mmol/L (ref 22–32)
Calcium: 8.7 mg/dL — ABNORMAL LOW (ref 8.9–10.3)
Creatinine, Ser: 1.08 mg/dL (ref 0.61–1.24)
GFR calc Af Amer: >60 mL/min (ref >60)
GFR calc non Af Amer: >60 mL/min (ref >60)
Glucose, Bld: 303 mg/dL — ABNORMAL HIGH (ref 70–99)
Potassium: 4.1 mmol/L (ref 3.5–5.1)
Sodium: 134 mmol/L — ABNORMAL LOW (ref 135–145)
Total Bilirubin: 0.4 mg/dL (ref 0.3–1.2)
Total Protein: 7.3 g/dL (ref 6.5–8.1)

## 2018-11-07 LAB — URINALYSIS, ROUTINE W REFLEX MICROSCOPIC
Bacteria, UA: NONE SEEN
Bilirubin Urine: NEGATIVE
Glucose, UA: >=500 mg/dL — AB
Hgb urine dipstick: NEGATIVE
Ketones, ur: NEGATIVE mg/dL
Leukocytes,Ua: NEGATIVE
Nitrite: NEGATIVE
PROTEIN: NEGATIVE mg/dL
Specific Gravity, Urine: 1.015 (ref 1.005–1.030)
pH: 5 (ref 5.0–8.0)

## 2018-11-07 LAB — RAPID URINE DRUG SCREEN, HOSP PERFORMED
Amphetamines: NOT DETECTED
Barbiturates: NOT DETECTED
Benzodiazepines: NOT DETECTED
Cocaine: NOT DETECTED
Opiates: NOT DETECTED
Tetrahydrocannabinol: NOT DETECTED

## 2018-11-07 LAB — CBG MONITORING, ED
Glucose-Capillary: 299 mg/dL — ABNORMAL HIGH (ref 70–99)
Glucose-Capillary: 272 mg/dL — ABNORMAL HIGH (ref 70–99)

## 2018-11-07 MED ORDER — ACETAMINOPHEN 325 MG PO TABS
650.0000 mg | ORAL_TABLET | Freq: Once | ORAL | Status: AC
  Administered 2018-11-07: 650 mg via ORAL
  Filled 2018-11-07: qty 2

## 2018-11-07 MED ORDER — GLUCAGON (RDNA) 1 MG IJ KIT: 1.0000 mg | PACK | Freq: Once | INTRAMUSCULAR | 2 refills | Status: DC | PRN

## 2018-11-07 NOTE — Telephone Encounter (Signed)
Patient's Wife would like a call back to give more information about patient's episode she stated he had .  . she had to call the Ambulance early this morning due to his sugar being so low.   Please advise

## 2018-11-07 NOTE — Telephone Encounter (Signed)
Please see note from pt's ER visit today r/t hypoglycemic episode.
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
## 2018-11-07 NOTE — Telephone Encounter (Signed)
Pt's wife called and was given MD message. Pt's wife verbalized understanding. Rx for glucagon kit sent.

## 2018-11-07 NOTE — ED Notes (Addendum)
Wife removed insulin pump at home prior to EMS arrival, insulin pump with patient. Wife is unsure patient ate prior to bed.  Also removed glucometer on right arm

## 2018-11-07 NOTE — ED Triage Notes (Signed)
Low glucose reading at home, after home treatment 107 when ems arrived. At current time reading is 299

## 2018-11-07 NOTE — Telephone Encounter (Signed)
He can reduce his basal rate at midnight down to 0.95.  Also need to send in prescription for glucagon injection kit so that his wife can use it when he has a severe low sugar.

## 2018-11-07 NOTE — ED Notes (Signed)
Patient transported to CT 

## 2018-11-07 NOTE — ED Provider Notes (Signed)
  Physical Exam  BP 118/77   Pulse 61   Temp (!) 97.4 F (36.3 C) (Oral)   Resp 20   Ht 5\' 4"  (1.626 m)   Wt 79.4 kg   SpO2 96%   BMI 30.04 kg/m   Physical Exam  ED Course/Procedures     Procedures  MDM  Received patient in signout.  Sugar has been stable and if anything a little high.  Patient improving back towards baseline.  Complaining of neck pain.  Merging reassuring.  Discharge home.  Patient states he has some baclofen at home.  Discharge home.       Benjiman Core, MD 11/07/18 [phone]

## 2018-11-07 NOTE — ED Notes (Signed)
Ok per EDP to start insulin pump and give pt something to drink. Pt complaining of soreness. Tylenol and apple juice given. EDP at bedside to explain discharge instruction. Pt ready to go home.

## 2018-11-07 NOTE — ED Notes (Signed)
Patient given discharge instruction, verbalized understand. IV removed, band aid applied. Patient ambulatory out of the department.  

## 2018-11-12 ENCOUNTER — Telehealth: Payer: Self-pay

## 2018-11-12 NOTE — Telephone Encounter (Signed)
PA initiated via [email or web address] for Glucagon injection kit. WKG:[id number]

## 2018-11-19 ENCOUNTER — Ambulatory Visit: Payer: Medicare Other | Admitting: Endocrinology

## 2018-11-19 ENCOUNTER — Other Ambulatory Visit: Payer: Self-pay

## 2018-11-19 ENCOUNTER — Encounter: Payer: Medicare Other | Admitting: Nutrition

## 2018-11-19 ENCOUNTER — Encounter: Payer: Medicare Other | Attending: Endocrinology | Admitting: Nutrition

## 2018-11-19 DIAGNOSIS — E1165 Type 2 diabetes mellitus with hyperglycemia: Secondary | ICD-10-CM | POA: Insufficient documentation

## 2018-11-19 MED ORDER — DEXCOM G6 SENSOR MISC: 1.0000 | 12 refills | Status: DC

## 2018-11-19 MED ORDER — DEXCOM G6 TRANSMITTER MISC: 1.0000 | 4 refills | Status: DC

## 2018-11-19 MED ORDER — DEXCOM G6 RECEIVER DEVI: 1.0000 | Freq: Every day | 0 refills | Status: AC

## 2018-11-19 NOTE — Progress Notes (Signed)
Patient is here with his wife.  He says he has memory issues and needs his wife present.  He did not get his Dexcom.  We will start the pump using his Josephine Igo, and he will pick up the Dexcom from the pharmacy and come in tomorrow to like it.  He was told download the dexcom app., and shown which one to use.  He will do this later today.  Settings were put into pump per old Paradigm: Time  Basal rate    I/C   ISF  Target  Timing MN           0.95,        7     40    120        4 5AM          1.1          7AM          1.3           5      30  10AM        1.25 12P                            4 1P             1.4                     2PM:                                  25 4PM:          1.7                6PM           1.4                   30 8:30            1.6                   10P                                    40  He was shown how to give a bolus and redemonstrated this 3 times.  His blood sugar was 326 4 hours pcB and did a correction dose per pump settings.  We reviewed how to put the pump in stop, change a cartridge do temp basal rates, adjust the boluses for exercise.  He was given handouts on all of the above. He filled a cartridge with Humalog and attached a autosoft 90 56mm 23 inch infuison set without any difficulty.   Reviewed the need to verify settings before giving boluses, and he reported good understanding of this.  Their computer is not capable of using the T connect, but their daughter's is,  It was stressed of the need to download from home and he was given a handout on how to do this.  He will come in tomorrow to link his Dexcom and I will make sure he is able to download from home.   He is using the [employer] now, and it read only 286.  Stressed the need to test blood sugars before meals and at bedtime using  his meter and finger stick.

## 2018-11-20 ENCOUNTER — Encounter: Payer: Medicare Other | Admitting: Endocrinology

## 2018-11-20 ENCOUNTER — Other Ambulatory Visit: Payer: Self-pay

## 2018-11-20 ENCOUNTER — Encounter: Payer: Self-pay | Admitting: Endocrinology

## 2018-11-20 ENCOUNTER — Encounter: Payer: Medicare Other | Admitting: Nutrition

## 2018-11-20 NOTE — Progress Notes (Signed)
This encounter was created in error - please disregard.

## 2018-11-21 ENCOUNTER — Ambulatory Visit (INDEPENDENT_AMBULATORY_CARE_PROVIDER_SITE_OTHER): Payer: Medicare Other | Admitting: Endocrinology

## 2018-11-21 ENCOUNTER — Other Ambulatory Visit: Payer: Self-pay

## 2018-11-21 ENCOUNTER — Encounter: Payer: Self-pay | Admitting: Endocrinology

## 2018-11-21 DIAGNOSIS — E1065 Type 1 diabetes mellitus with hyperglycemia: Secondary | ICD-10-CM
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
## 2018-11-22 ENCOUNTER — Ambulatory Visit (INDEPENDENT_AMBULATORY_CARE_PROVIDER_SITE_OTHER): Payer: Medicare Other | Admitting: Endocrinology

## 2018-11-22 ENCOUNTER — Encounter: Payer: Self-pay | Admitting: Endocrinology

## 2018-11-22 ENCOUNTER — Other Ambulatory Visit: Payer: Self-pay

## 2018-11-22 ENCOUNTER — Ambulatory Visit: Payer: Medicare Other | Admitting: Endocrinology

## 2018-11-22 DIAGNOSIS — E1065 Type 1 diabetes mellitus with hyperglycemia: Secondary | ICD-10-CM | POA: Diagnosis not present

## 2018-11-22 MED ORDER — GLUCAGON HCL (RDNA) 1 MG IJ SOLR: 1.0000 mg | Freq: Once | INTRAMUSCULAR | 3 refills | Status: AC | PRN
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
## 2018-11-27 ENCOUNTER — Telehealth: Payer: Self-pay | Admitting: Nutrition

## 2018-11-27 NOTE — Telephone Encounter (Signed)
Phoned patient to see if he can come in tomorrow to finish training on his insulin pump.  He was given times and will will call tomorrow with the time he is able to come in.

## 2018-11-27 NOTE — Patient Instructions (Signed)
REad over manual Pick up Dexcom from pharmacy and come by tomorrow for help with linking this to the pump and phone.

## 2018-12-12 ENCOUNTER — Other Ambulatory Visit: Payer: Self-pay

## 2018-12-12 MED ORDER — INSULIN LISPRO 100 UNIT/ML ~~LOC~~ SOLN: SUBCUTANEOUS | 3 refills | Status: DC

## 2019-02-26 ENCOUNTER — Encounter: Payer: Medicare Other | Attending: Endocrinology | Admitting: Nutrition

## 2019-02-26 ENCOUNTER — Other Ambulatory Visit: Payer: Self-pay

## 2019-02-26 DIAGNOSIS — E1165 Type 2 diabetes mellitus with hyperglycemia: Secondary | ICD-10-CM | POA: Insufficient documentation

## 2019-03-04 NOTE — Patient Instructions (Signed)
Call if questions. 

## 2019-03-04 NOTE — Progress Notes (Signed)
Patient was trained on how to use the tandem pump,  His control IQ was turned on and is being used, without difficulty.   We reviewed all topics on the pump checklist and he reported good understanding of all topics.  He signed the checklist as understanding all topics.  He reports that he very much likes the pump and has no problems with it.   He had no final questions.

## 2019-03-31 ENCOUNTER — Emergency Department (HOSPITAL_COMMUNITY)
Admission: EM | Admit: 2019-03-31 | Discharge: 2019-03-31 | Disposition: A | Discharge status: Home / Self Care | Payer: Medicare Other | Attending: Emergency Medicine | Admitting: Emergency Medicine

## 2019-03-31 ENCOUNTER — Encounter (HOSPITAL_COMMUNITY): Payer: Self-pay | Admitting: Emergency Medicine

## 2019-03-31 ENCOUNTER — Other Ambulatory Visit: Payer: Self-pay

## 2019-03-31 ENCOUNTER — Emergency Department (HOSPITAL_COMMUNITY): Payer: Medicare Other

## 2019-03-31 DIAGNOSIS — Z5321 Procedure and treatment not carried out due to patient leaving prior to being seen by health care provider: Secondary | ICD-10-CM | POA: Insufficient documentation

## 2019-03-31 DIAGNOSIS — R079 Chest pain, unspecified: Secondary | ICD-10-CM | POA: Insufficient documentation

## 2019-03-31 LAB — TROPONIN I (HIGH SENSITIVITY): Troponin I (High Sensitivity): 2 ng/L (ref <18)

## 2019-03-31 LAB — BASIC METABOLIC PANEL
Anion gap: 8 (ref 5–15)
BUN: 20 mg/dL (ref 6–20)
CO2: 27 mmol/L (ref 22–32)
Calcium: 9.2 mg/dL (ref 8.9–10.3)
Chloride: 103 mmol/L (ref 98–111)
Creatinine, Ser: 1.09 mg/dL (ref 0.61–1.24)
GFR calc Af Amer: >60 mL/min (ref >60)
GFR calc non Af Amer: >60 mL/min (ref >60)
Glucose, Bld: 110 mg/dL — ABNORMAL HIGH (ref 70–99)
Potassium: 4 mmol/L (ref 3.5–5.1)
Sodium: 138 mmol/L (ref 135–145)

## 2019-03-31 LAB — CBC
HCT: 42 % (ref 39.0–52.0)
Hemoglobin: 14.1 g/dL (ref 13.0–17.0)
MCH: 29.9 pg (ref 26.0–34.0)
MCHC: 33.6 g/dL (ref 30.0–36.0)
MCV: 89.2 fL (ref 80.0–100.0)
Platelets: 320 10*3/uL (ref 150–400)
RBC: 4.71 MIL/uL (ref 4.22–5.81)
RDW: 12.3 % (ref 11.5–15.5)
WBC: 9.6 10*3/uL (ref 4.0–10.5)
nRBC: 0 % (ref 0.0–0.2)

## 2019-03-31 NOTE — ED Triage Notes (Addendum)
Pt c/o mid chest tightness that radiates to back x 2 days. Pt reports when he tries to take a deep breath, the pain with shoot into his back. Pt endorses loss of appetite and diaphoresis. CBG 131. Pt has CGM on abdomen.

## 2019-06-03 ENCOUNTER — Other Ambulatory Visit: Payer: Self-pay

## 2019-06-03 MED ORDER — HUMALOG 100 UNIT/ML ~~LOC~~ SOLN: SUBCUTANEOUS | 3 refills | Status: DC

## 2019-06-11 ENCOUNTER — Other Ambulatory Visit: Payer: Self-pay | Admitting: Sports Medicine

## 2019-06-11 ENCOUNTER — Other Ambulatory Visit (HOSPITAL_COMMUNITY): Payer: Self-pay | Admitting: Sports Medicine

## 2019-06-11 DIAGNOSIS — M5127 Other intervertebral disc displacement, lumbosacral region: Secondary | ICD-10-CM

## 2019-06-17 ENCOUNTER — Ambulatory Visit (HOSPITAL_COMMUNITY)
Admission: RE | Admit: 2019-06-17 | Discharge: 2019-06-17 | Disposition: A | Discharge status: Home / Self Care | Payer: Medicare Other | Source: Ambulatory Visit | Attending: Sports Medicine | Admitting: Sports Medicine

## 2019-06-17 ENCOUNTER — Other Ambulatory Visit: Payer: Self-pay

## 2019-06-17 DIAGNOSIS — M5127 Other intervertebral disc displacement, lumbosacral region: Secondary | ICD-10-CM | POA: Diagnosis present

## 2019-07-15 ENCOUNTER — Other Ambulatory Visit: Payer: Self-pay | Admitting: Endocrinology

## 2019-07-15 DIAGNOSIS — E1065 Type 1 diabetes mellitus with hyperglycemia: Secondary | ICD-10-CM

## 2019-07-15 DIAGNOSIS — E782 Mixed hyperlipidemia: Secondary | ICD-10-CM

## 2019-08-12 ENCOUNTER — Other Ambulatory Visit: Payer: Self-pay

## 2019-08-12 ENCOUNTER — Other Ambulatory Visit (INDEPENDENT_AMBULATORY_CARE_PROVIDER_SITE_OTHER): Payer: Medicare Other

## 2019-08-12 DIAGNOSIS — E782 Mixed hyperlipidemia: Secondary | ICD-10-CM

## 2019-08-12 DIAGNOSIS — E1065 Type 1 diabetes mellitus with hyperglycemia: Secondary | ICD-10-CM | POA: Diagnosis not present

## 2019-08-12 LAB — COMPREHENSIVE METABOLIC PANEL
ALT: 26 U/L (ref 0–53)
AST: 20 U/L (ref 0–37)
Albumin: 3.8 g/dL (ref 3.5–5.2)
Alkaline Phosphatase: 78 U/L (ref 39–117)
BUN: 17 mg/dL (ref 6–23)
CO2: 29 mEq/L (ref 19–32)
Calcium: 9.1 mg/dL (ref 8.4–10.5)
Chloride: 102 mEq/L (ref 96–112)
Creatinine, Ser: 1.25 mg/dL (ref 0.40–1.50)
GFR: 61.72 mL/min (ref >60.00)
Glucose, Bld: 143 mg/dL — ABNORMAL HIGH (ref 70–99)
Potassium: 3.9 mEq/L (ref 3.5–5.1)
Sodium: 137 mEq/L (ref 135–145)
Total Bilirubin: 0.4 mg/dL (ref 0.2–1.2)
Total Protein: 6.7 g/dL (ref 6.0–8.3)

## 2019-08-12 LAB — LIPID PANEL
Cholesterol: 146 mg/dL (ref 0–200)
HDL: 38.4 mg/dL — ABNORMAL LOW (ref >39.00)
LDL Cholesterol: 86 mg/dL (ref 0–99)
NonHDL: 107.35
Total CHOL/HDL Ratio: 4
Triglycerides: 105 mg/dL (ref 0.0–149.0)
VLDL: 21 mg/dL (ref 0.0–40.0)

## 2019-08-12 LAB — MICROALBUMIN / CREATININE URINE RATIO
Creatinine,U: 159.9 mg/dL
Microalb Creat Ratio: 0.4 mg/g (ref 0.0–30.0)
Microalb, Ur: <0.7 mg/dL (ref 0.0–1.9)

## 2019-08-12 LAB — HEMOGLOBIN A1C: Hgb A1c MFr Bld: 8.3 % — ABNORMAL HIGH (ref 4.6–6.5)
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
## 2019-08-19 ENCOUNTER — Other Ambulatory Visit: Payer: Self-pay

## 2019-08-19 ENCOUNTER — Ambulatory Visit (INDEPENDENT_AMBULATORY_CARE_PROVIDER_SITE_OTHER): Payer: Medicare Other | Admitting: Endocrinology

## 2019-08-19 ENCOUNTER — Encounter: Payer: Self-pay | Admitting: Endocrinology

## 2019-08-19 DIAGNOSIS — E782 Mixed hyperlipidemia: Secondary | ICD-10-CM

## 2019-08-19 DIAGNOSIS — E1065 Type 1 diabetes mellitus with hyperglycemia: Secondary | ICD-10-CM | POA: Diagnosis not present

## 2019-08-20 ENCOUNTER — Telehealth: Payer: Self-pay

## 2019-08-20 NOTE — Telephone Encounter (Signed)
He needs to call his PCP office to have them refill this, I have not prescribed this in the past

## 2019-08-20 NOTE — Telephone Encounter (Signed)
pharmacy has denied his citalopram (CELEXA) 40 MG tablet.  States that his PCP normally handles this they have not responded to pharmacy in 10 days and he needs his medication because he takes it everyday.   Patient has started feeling a difference and want Dr to refill this for him and just take it over     Please call and advise

## 2019-08-21 NOTE — Telephone Encounter (Signed)
Called pt and gave him MD message. Pt verbalized understanding. 

## 2019-08-27 ENCOUNTER — Other Ambulatory Visit: Payer: Self-pay

## 2019-08-27 MED ORDER — HUMALOG 100 UNIT/ML ~~LOC~~ SOLN: SUBCUTANEOUS | 3 refills | Status: DC

## 2019-10-09 ENCOUNTER — Telehealth: Payer: Self-pay | Admitting: Nutrition

## 2019-10-09 NOTE — Telephone Encounter (Signed)
Prescription for Control IQ signed on 12/07/18

## 2019-10-17 ENCOUNTER — Other Ambulatory Visit: Payer: Self-pay
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
## 2019-10-21 ENCOUNTER — Ambulatory Visit: Payer: Medicare Other | Admitting: Endocrinology

## 2019-10-28 ENCOUNTER — Encounter: Payer: Self-pay | Admitting: Endocrinology

## 2019-10-28 ENCOUNTER — Ambulatory Visit (INDEPENDENT_AMBULATORY_CARE_PROVIDER_SITE_OTHER): Payer: Managed Care, Other (non HMO) | Admitting: Endocrinology

## 2019-10-28 ENCOUNTER — Other Ambulatory Visit: Payer: Self-pay

## 2019-10-28 VITALS — BP 108/60 | HR 66 | Ht 64.0 in | Wt 171.4 lb

## 2019-10-28 DIAGNOSIS — E1065 Type 1 diabetes mellitus with hyperglycemia: Secondary | ICD-10-CM

## 2019-10-28 LAB — POCT GLYCOSYLATED HEMOGLOBIN (HGB A1C): Hemoglobin A1C: 7.9 % — AB (ref 4.0–5.6)

## 2019-10-28 NOTE — Progress Notes (Signed)
Patient ID: Alex Tucker, male   DOB: November 13, 1970, 49 y.o.   MRN: [id number]    Reason for Appointment: Follow-up of insulin-dependent diabetes   History of Present Illness:          Diagnosis: Type 1 diabetes mellitus, date of diagnosis:  2014      Past history:  He apparently had no symptoms at diagnosis and his blood sugar a year before at his physical was normal Details of his initial diagnosis are not available but he thinks his blood sugars were about 180 at that time with A1c around 9% He was initially tried on metformin but he could not tolerate this because of diarrhea Subsequently on his own he was trying to lose weight with diet and apparently lost 40 pounds around the end of 2014 His PCP started him on insulin in 12/14 approximately when his A1c was high and he was also losing weight. His initial consultation he was told to try Invokana and Victoza along with his basal insulin but he could not get Victoza because of non-coverage by his insurance.  Also he was having side effects of not feeling well and having some nausea with Invokana and stopped this in 8/15 after Also his Levemir was changed to Lantus insulin once a day; initially was started on 20 units   Recent history:   He had been on a [employer]  Pump since 08/29/14 because of poor control with basal bolus insulin regimens; pre-pump pump A1c was over 10%  Current insulin pump: T-Slim control IQ since 11/20/2018  PUMP settings: Basal rates  midnight = 0.95, 1 AM = 0.7, 4 AM = 1.1, 7 AM = 1. 30.  10 AM = 1.25, 1 PM = 1.4,, 8 PM = 1.8 and 11 PM = 1.55  Carbohydrate coverage 1:7 until 7 AM, 7 AM and 10 AM -  = 5.0, and 12 noon = 1: 4, after 7 PM = 1: 4 Sensitivity 1: 40 at midnight , 1: 30 from 7 AM,, 1: 25 at 2 PM, with target 120 Active insulin 4 hours  Overall average insulin use per day is about 70 units  Blood sugars were reviewed from his insulin pump data which was downloaded and printed  Current  management, blood sugar patterns and problems identified are as follows:  His A1c is slightly better at 7.9: Previously usually over 8%    He is still relatively insulin resistant with needing significant amount of insulin, up to 40 units/day in boluses  Although he is not physically very active appears that occasionally blood sugars may be relatively lower when he is more active late afternoon  His data target is set for 80-120 which makes analysis somewhat difficult  He thinks he will get low sugars during the night if he does not have a Trail mix bar at bedtime without a bolus  However overnight HYPOGLYCEMIA has been less compared to last visit  Lowest blood sugar however has been down to 40  Still has significant variability in blood sugars  Postprandial readings do not show any consistent pattern but does not appear to be needing a higher carbohydrate ratio including at dinnertime despite blood sugars being mostly higher late evening   CONTINUOUS GLUCOSE MONITORING RECORD INTERPRETATION    Dates of Recording: 2/22 through 3/7  Sensor description: G6  Results statistics:   CGM use % of time  98  Average and SD  160+/-65  Time in range       ?  %  %  Time Above 180   % Time above 250  9%  % Time Below 70  4%     Glycemic patterns summary: Blood sugars show marked variability overnight, midday and late evening Blood sugars are on an average lower midmorning and before dinnertime Has tendency to blood sugars i below 100 occasionally around 1 AM, late morning and early afternoon as well as before dinner  Hyperglycemic episodes    Hypoglycemic episodes occurring irregularly at different times Has tendency to blood sugars  below 100 occasionally around 1 AM, late morning and early afternoon as well as before dinner Although blood sugars below 70 about 4% of the time he has blood sugars below 100 about 14% of the time  Overnight periods: Blood sugars are highly variable  with no consistent pattern normal blood sugars as low as about 50 and as high as 350 Mostly blood sugars are averaging between 140-170  Preprandial/postprandial periods:     Before breakfast: Glucose is usually about 130-140 average without low sugars. AFTER breakfast blood sugars are usually fairly flat no pain currently is not eating breakfast  Before lunch:   Blood sugar at lunchtime is highly variable and averaging about 170 AFTER lunch blood sugars generally do not show excessive rise based on premeal blood sugar  Before dinner: Glucose at dinnertime is averaging about 120-140 with moderate variability After dinner blood sugars Occasionally go up significantly but patient did not within the first couple of hours   PRE-MEAL  12 AM-6 AM Lunch Dinner  6 PM + Overall  Glucose range:       Mean/median:  166  139  164  170+/-75  160      Side effects from medications have been: Metformin causes diarrhea, Invokana causes nausea and malaise   Glycemic control:   Lab Results  Component Value Date   HGBA1C 8.3 (H) 08/12/2019   HGBA1C 8.9 (H) 09/13/2018   HGBA1C 8.4 (A) 05/30/2018   Lab Results  Component Value Date   MICROALBUR <0.7 08/12/2019   LDLCALC 86 08/12/2019   CREATININE 1.25 08/12/2019    Self-care: The diet that the patient has been following [id number] low fat  Meals: 3 meals per day. 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  Latest known visit with results is:  Lab on 08/12/2019  Component Date Value Ref Range Status    . Cholesterol 08/12/2019 146  0 - 200 mg/dL Final   ATP III Classification       Desirable:  < 200 mg/dL               Borderline High:  200 - 239 mg/dL          High:  > = 240 mg/dL  . Triglycerides 08/12/2019 105.0  0.0 - 149.0 mg/dL Final   Normal:  <150 mg/dLBorderline High:  150 -  199 mg/dL  . HDL 08/12/2019 38.40* >39.00 mg/dL Final  . VLDL 16/05/9603 21.0  0.0 - 40.0 mg/dL Final  . LDL Cholesterol 08/12/2019 86  0 - 99 mg/dL Final  . Total CHOL/HDL Ratio 08/12/2019 4   Final                  Men          Women1/2 Average Risk     3.4          3.3Average Risk          5.0          4.42X Average Risk          9.6          7.13X Average Risk          15.0          11.0                      . NonHDL 08/12/2019 107.35   Final   NOTE:  Non-HDL goal should be 30 mg/dL higher than patient's LDL goal (i.e. LDL goal of < 70 mg/dL, would have non-HDL goal of < 100 mg/dL)  . Microalb, Ur 08/12/2019 <0.7  0.0 - 1.9 mg/dL Final  . Creatinine,U 54/04/8118 159.9  mg/dL Final  . Microalb Creat Ratio 08/12/2019 0.4  0.0 - 30.0 mg/g Final  . Sodium 08/12/2019 137  135 - 145 mEq/L Final  . Potassium 08/12/2019 3.9  3.5 - 5.1 mEq/L Final  . Chloride 08/12/2019 102  96 - 112 mEq/L Final  . CO2 08/12/2019 29  19 - 32 mEq/L Final  . Glucose, Bld 08/12/2019 143* 70 - 99 mg/dL Final  . BUN 14/78/2956 17  6 - 23 mg/dL Final  . Creatinine, Ser 08/12/2019 1.25  0.40 - 1.50 mg/dL Final  . Total Bilirubin 08/12/2019 0.4  0.2 - 1.2 mg/dL Final  . Alkaline Phosphatase 08/12/2019 78  39 - 117 U/L Final  . AST 08/12/2019 20  0 - 37 U/L Final  . ALT 08/12/2019 26  0 - 53 U/L Final  . Total Protein 08/12/2019 6.7  6.0 - 8.3 g/dL Final  . Albumin 21/30/8657 3.8  3.5 - 5.2 g/dL Final  . GFR 84/69/6295 61.72  >60.00 mL/min Final  . Calcium 08/12/2019 9.1  8.4 - 10.5 mg/dL Final  . Hgb M8U MFr Bld 08/12/2019 8.3* 4.6 - 6.5 % Final   Glycemic Control Guidelines for People with Diabetes:Non Diabetic:  <6%Goal of  Therapy: <7%Additional Action Suggested:  >8%     Allergies as of 10/28/2019      Reactions   Codeine Itching      Medication List       Accurate as of October 28, 2019  8:43 AM. If you have any questions, ask your nurse or doctor.        acetaminophen 325 MG tablet Commonly known as: TYLENOL Take 325-650 mg by mouth every 6 (six) hours as needed for moderate pain or headache.   aspirin-acetaminophen-caffeine 250-250-65 MG tablet Commonly known as: EXCEDRIN MIGRAINE Take 1 tablet by mouth every 8 (eight) hours as needed for headache.   atenolol 25 MG tablet Commonly known as: TENORMIN Take 50 mg by mouth at bedtime.   atorvastatin 20 MG tablet Commonly known as: LIPITOR Take 20 mg by mouth at bedtime.   citalopram 40 MG tablet Commonly known as: CELEXA Take 40 mg by mouth at bedtime.  Dexcom G6 Receiver Devi 1 each by Does not apply route daily. Use receiver to monitor blood glucose continuously.   Dexcom G6 Sensor Misc 1 each by Does not apply route See admin instructions. Apply 1 sensor to body every 10 days for continuous glucose monitoring.   Dexcom G6 Transmitter Misc 1 each by Does not apply route See admin instructions. Use transmitter with G6 sensor daily. Change every 90 days.   glucagon 1 MG Solr injection Commonly known as: GLUCAGEN Inject 1 mg into the vein once as needed for low blood sugar. Use as needed for hypoglycemia   HumaLOG 100 UNIT/ML injection Generic drug: insulin lispro USE UP TO 100 UNITS DAILY WITH  INSULIN  PUMP DX:E10.65   ibuprofen 200 MG tablet Commonly known as: ADVIL Take 400 mg by mouth every 6 (six) hours as needed for headache or moderate pain.   levETIRAcetam 1000 MG tablet Commonly known as: KEPPRA Take 1,000 mg by mouth 2 (two) times daily.   methocarbamol 500 MG tablet Commonly known as: ROBAXIN Take 1 tablet (500 mg total) by mouth every 8 (eight) hours as needed for muscle spasms.   naproxen 500 MG tablet Commonly  known as: NAPROSYN Take 1 tablet (500 mg total) by mouth 2 (two) times daily with a meal.   omeprazole 20 MG capsule Commonly known as: PRILOSEC Take 40 mg by mouth at bedtime.   UNABLE TO FIND TANDEM INSULIN PUMP AND SUPPLIES       Allergies:  Allergies  Allergen Reactions  . Codeine Itching    Past Medical History:  Diagnosis Date  . Arthritis    knees, shoulder  . Diabetes mellitus without complication (HCC)    Type II per Dr Lucianne MussKumar  . GERD (gastroesophageal reflux disease)   . High cholesterol   . History of gout   . History of kidney stones    several  . IBS (irritable bowel syndrome)   . 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  [name]. TBI (traumatic brain injury) [facility](HCC)     Past Surgical History:  Procedure Laterality Date  . HAND SURGERY Right    tendon repair- thumb right hand.  [name]. SHOULDER ARTHROSCOPY  08/03/2017   Procedure: RIGHT SHOULDER  DIAGNOSTIC ARTHROSCOPY;  Surgeon: Vickki HearingHarrison, Stanley E, MD;  Location: [facility];  Service: Orthopedics;;  . SHOULDER ARTHROSCOPY WITH LABRAL REPAIR Right 08/29/2017   Procedure: RIGHT SHOULDER ARTHROSCOPY WITH DEBRIDEMENT, LABRAL REPAIR, BICEPS TENODESIS;  Surgeon: Cammy Copaean, Gregory Scott, MD;  Location: [facility];  Service: Orthopedics;  Laterality: Right;    Family History  Problem Relation Age of Onset  . Diabetes Mother   . Diabetes Paternal Grandmother   . Diabetes Paternal Grandfather     Social History:  reports that he has been smoking cigarettes. He has a 15.00 pack-year smoking history. He has never used smokeless tobacco. He reports that he does not drink alcohol or use drugs.    Review of Systems       Lipids:    He is on Lipitor 20 mg, this is controlling his LDL below 100  HDL low as before although just below 40      Lab Results  Component Value Date   CHOL 146 08/12/2019   HDL 38.40 (L) 08/12/2019   LDLCALC 86 08/12/2019   LDLDIRECT 148.0  03/26/2015   TRIG 105.0 08/12/2019   CHOLHDL 4 08/12/2019   No history  of hypertension    He takes citalopram 40 mg   Physical Examination:  BP 108/60 (BP Location: Right Arm, Patient Position: Sitting, Cuff Size: Normal)   Pulse 66   Ht 5\' 4"  (1.626 m)   Wt 171 lb 6.4 oz (77.7 kg)   SpO2 96%   BMI 29.42 kg/m       ASSESSMENT/PLAN:   Diabetes, insulin-dependent on insulin pump  See history of present illness for review of his current blood sugar patterns, problems identified  and pump  Management  Blood sugars are improving with A1c 7.9 without excessive hypoglycemia  Although his variability is still persistent he has somewhat less tendency to low sugars including overnight Blood sugars after meals are variable but not consistently going higher or lower Low blood sugars may be either from increased activity or occasionally overestimating boluses  His insulin pump download in detail today variability in blood sugars, patterns were reviewed and discussed causative factors for high and low sugars Recommendations were discussed in detail with the patient Will likely need to reassess in 3 months and continue to make changes as needed  The following changes were made in the pump settings: Basal rates midnight = 0.85 instead of 0.95.  Basal rate at 11 PM = 1.55, higher Carbohydrate ratio 1:6 after 10 PM  Make sure he is using exercise more than planning to be active Also take extra insulin either with or after eating for higher fat meals  LIPIDS: To recheck on the next visit 10/28/2019, 8:43 AM   Note: This office note was prepared with Dragon voice recognition system technology. Any transcriptional errors that result from this process are unintentional.

## 2019-11-22 ENCOUNTER — Other Ambulatory Visit: Payer: Self-pay | Admitting: Endocrinology

## 2020-01-06 LAB — LIPID PANEL
Cholesterol: 119 (ref 0–200)
HDL: 32 — AB (ref 35–70)
LDL Cholesterol: 17
Triglycerides: 85 (ref 40–160)

## 2020-01-06 LAB — BASIC METABOLIC PANEL: Creatinine: 1.1 (ref <=1.3)

## 2020-01-07 ENCOUNTER — Other Ambulatory Visit: Payer: Self-pay | Admitting: Endocrinology

## 2020-01-24 ENCOUNTER — Other Ambulatory Visit: Payer: Self-pay

## 2020-01-27 ENCOUNTER — Ambulatory Visit (INDEPENDENT_AMBULATORY_CARE_PROVIDER_SITE_OTHER): Payer: Self-pay | Admitting: Endocrinology

## 2020-01-27 ENCOUNTER — Encounter: Payer: Self-pay | Admitting: Endocrinology

## 2020-01-27 VITALS — BP 110/60 | HR 62 | Ht 64.0 in | Wt 171.6 lb

## 2020-01-27 DIAGNOSIS — E1065 Type 1 diabetes mellitus with hyperglycemia: Secondary | ICD-10-CM

## 2020-01-27 DIAGNOSIS — E7800 Pure hypercholesterolemia, unspecified: Secondary | ICD-10-CM

## 2020-01-27 LAB — POCT GLYCOSYLATED HEMOGLOBIN (HGB A1C): Hemoglobin A1C: 7.8 % — AB (ref 4.0–5.6)

## 2020-01-27 NOTE — Progress Notes (Signed)
Patient ID: Alex Tucker, male   DOB: 24-Sep-1970, 49 y.o.   MRN: [id number]    Reason for Appointment: Follow-up of insulin-dependent diabetes   History of Present Illness:          Diagnosis: Type 1 diabetes mellitus, date of diagnosis:  2014      Past history:  He apparently had no symptoms at diagnosis and his blood sugar a year before at his physical was normal Details of his initial diagnosis are not available but he thinks his blood sugars were about 180 at that time with A1c around 9% He was initially tried on metformin but he could not tolerate this because of diarrhea Subsequently on his own he was trying to lose weight with diet and apparently lost 40 pounds around the end of 2014 His PCP started him on insulin in 12/14 approximately when his A1c was high and he was also losing weight. His initial consultation he was told to try Invokana and Victoza along with his basal insulin but he could not get Victoza because of non-coverage by his insurance.  Also he was having side effects of not feeling well and having some nausea with Invokana and stopped this in 8/15 after Also his Levemir was changed to Lantus insulin once a day; initially was started on 20 units   Recent history:   He had been on insulin pumps since 08/29/14 because of poor control with basal bolus insulin regimens; pre-pump pump A1c was >10%  Current insulin pump: T-Slim control IQ since 11/20/2018  PUMP settings: Basal rates  midnight = 0.95, 1 AM = 0.7, 4 AM = 1.1, 7 AM = 1. 30.  10 AM = 1.25, 1 PM = 1.4,, 8 PM = 1.8 and 11 PM = 1.55  Carbohydrate coverage 1:7 until 7 AM, 7 AM and 10 AM -  = 5.0, and 12 noon = 1: 4, after 7 PM = 1: 4 Sensitivity 1: 40 at midnight , 1: 30 from 7 AM,, 1: 25 at 2 PM, with target 120 Active insulin 4 hours  Overall average insulin use per day is about 70 units  Current management, blood sugar patterns and problems identified are as follows:  His A1c is slightly better at  7.8: Previously usually over 8%    He is still having significant variability of his blood sugars although slightly lower extended deviation compared to his last visit  Time in target is 66%, previously did not have the parameters set for 70-180  Further details are discussed below  Average blood sugars on CGM:  PRE-MEAL  12 AM-6 AM  6 AM-12 PM  12 PM-6 PM  6 PM + Overall  Glucose range:       Mean/median:  174  139  162  174  162+/-59    CONTINUOUS GLUCOSE MONITORING RECORD INTERPRETATION    Dates of Recording: Last 2 weeks  Sensor description: G6  Results statistics:   CGM use % of time   Average and SD   Time in range      66%  % Time Above 180  32  % Time above 250  9  % Time Below target 2    Glycemic patterns summary: On an average blood sugars are above target range mostly between 10 PM-1 AM but show considerable variability at those times also.  This causes the average blood sugar to be highest between 6 PM and 6 AM On an average lowest blood sugars are  the lowest between 9-11 AM with less variability also.   Hyperglycemic episodes are occurring on different days at all different times and occasionally persistently high readings, usually not having high readings around 9 AM  Hypoglycemic episodes occurred rarely in the early afternoon, usually transiently and more often linked to a bolus with lowest blood sugar about 43  Overnight periods: Blood sugars are highly variable with some days blood sugars are excellent and near target and other days relatively high  Preprandial periods: Glucose readings are usually averaging below 150 before breakfast, averaging 170+ at lunchtime and also at dinner  Postprandial periods:   After breakfast: Blood sugars are usually fairly flat After lunch: Blood sugars are variable but occasionally getting low but no consistent pattern After dinner: Blood sugar increased overall is not excessive but there is significant variability  with some days getting more hypoglycemia He thinks that his response to boluses for the same type of foods are very inconsistent Generally trying to bolus at the start of the meal   Side effects from medications have been: Metformin causes diarrhea, Invokana causes nausea and malaise   Glycemic control:   Lab Results  Component Value Date   HGBA1C 7.9 (A) 10/28/2019   HGBA1C 8.3 (H) 08/12/2019   HGBA1C 8.9 (H) 09/13/2018   Lab Results  Component Value Date   MICROALBUR <0.7 08/12/2019   LDLCALC 86 08/12/2019   CREATININE 1.25 08/12/2019    Self-care: The diet that the patient has been following [id number] low fat  Meals: 3 meals per day. 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  Latest known visit with results is:  Office Visit on 10/28/2019  Component Date Value Ref Range Status  . Hemoglobin A1C 10/28/2019 7.9* 4.0 - 5.6 % Final    Allergies as of 01/27/2020      Reactions   Codeine Itching      Medication List       Accurate as of January 27, 2020  9:41 AM. If you have any questions, ask your nurse or doctor.        acetaminophen 325 MG tablet Commonly known as: TYLENOL Take 325-650 mg by mouth every 6 (six) hours as needed for moderate pain or headache.   aspirin-acetaminophen-caffeine 250-250-65 MG tablet Commonly known as: EXCEDRIN MIGRAINE Take 1 tablet by mouth every 8 (eight) hours as needed for headache.   atenolol 25 MG tablet Commonly known as: TENORMIN Take 50 mg by mouth at  bedtime.   atorvastatin 20 MG tablet Commonly known as: LIPITOR Take 20 mg by mouth at bedtime.   citalopram 40 MG tablet Commonly known as: CELEXA Take 40 mg by mouth at bedtime.   Dexcom G6 Receiver Devi 1 each by Does not apply route daily. Use receiver to monitor blood glucose continuously.   Dexcom G6 Sensor Misc APPLY 1 SENSOR TO THE BODY  EVERY 10 DAYS FOR CONTINUOUS BLOOD SUGAR MONITORING   Dexcom G6 Transmitter Misc USE TRANSMITTER WITH G6 SENSOR DAILY. CHANGE EVERY 90 DAYS   glucagon 1 MG Solr injection Commonly known as: GLUCAGEN Inject 1 mg into the vein once as needed for low blood sugar. Use as needed for hypoglycemia   HumaLOG 100 UNIT/ML injection Generic drug: insulin lispro USE UP TO 100 UNITS DAILY WITH  INSULIN  PUMP DX:E10.65   ibuprofen 200 MG tablet Commonly known as: ADVIL Take 400 mg by mouth every 6 (six) hours as needed for headache or moderate pain.   levETIRAcetam 1000 MG tablet Commonly known as: KEPPRA Take 1,000 mg by mouth 2 (two) times daily.   methocarbamol 500 MG tablet Commonly known as: ROBAXIN Take 1 tablet (500 mg total) by mouth every 8 (eight) hours as needed for muscle spasms.   naproxen 500 MG tablet Commonly known as: NAPROSYN Take 1 tablet (500 mg total) by mouth 2 (two) times daily with a meal.   omeprazole 20 MG capsule Commonly known as: PRILOSEC Take 40 mg by mouth at bedtime.   UNABLE TO FIND TANDEM INSULIN PUMP AND SUPPLIES       Allergies:  Allergies  Allergen Reactions  . Codeine Itching    Past Medical History:  Diagnosis Date  . Arthritis    knees, shoulder  . Diabetes mellitus without complication (HCC)    Type II per Dr Lucianne Muss  . GERD (gastroesophageal reflux disease)   . High cholesterol   . History of gout   . History of kidney stones    several  . IBS (irritable bowel syndrome)   . 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  [name] TBI (traumatic brain injury) [facility])     Past Surgical History:  Procedure Laterality Date  . HAND SURGERY Right    tendon repair- thumb right hand.  [name] SHOULDER ARTHROSCOPY  08/03/2017   Procedure: RIGHT SHOULDER  DIAGNOSTIC ARTHROSCOPY;  Surgeon: Vickki Hearing, MD;  Location: [facility];  Service: Orthopedics;;  . SHOULDER ARTHROSCOPY WITH LABRAL REPAIR Right 08/29/2017   Procedure: RIGHT SHOULDER ARTHROSCOPY WITH DEBRIDEMENT, LABRAL REPAIR, BICEPS TENODESIS;  Surgeon: Cammy Copa, MD;  Location: [facility];  Service: Orthopedics;  Laterality: Right;    Family History  Problem Relation Age of Onset  . Diabetes Mother   . Diabetes Paternal Grandmother   . Diabetes Paternal Grandfather     Social History:  reports that he has been smoking cigarettes. He has a 15.00 pack-year smoking history. He has never used smokeless tobacco. He reports that he does not drink alcohol or use drugs.    Review of Systems       Lipids:    He is on Lipitor 20 mg, this is controlling his LDL below 100  HDL low as before although just below 40      Lab Results  Component Value Date   CHOL 146 08/12/2019   HDL 38.40 (L) 08/12/2019   LDLCALC 86 08/12/2019   LDLDIRECT 148.0 03/26/2015   TRIG 105.0 08/12/2019   CHOLHDL 4 08/12/2019   No history of microalbuminuria   He takes citalopram 40 mg   Physical Examination:  BP 110/60 (BP Location: Left Arm, Patient Position: Sitting, Cuff Size: Normal)   Pulse 62   Ht 5\' 4"  (1.626 m)   Wt 171 lb 9.6 oz (77.8 kg)   SpO2 97%  BMI 29.46 kg/m       ASSESSMENT/PLAN:   Diabetes, insulin-dependent on insulin pump  See history of present illness for review of his current blood sugar patterns, problems identified  and pump  Management  His overall control is fair with A1c 7.8 about the same as before  Blood sugars are still unpredictable and fluctuate from day-to-day No consistent pattern seen currently His response to mealtime  boluses seems to be inconsistent but still requiring 1: 4 carbohydrate ratio  Reminded him to use the exercise modality when planning to be more active than usual or going for his walk at work depending on preexercise blood sugar To call if he is having consistently high or low blood sugar response with carbohydrate ratio changes Bolus ahead of time for meals consistently Use a higher correction factor since he thinks that sometimes blood sugars do not come down with doing correction boluses Make sure he changes infusion set or the site if the blood sugar appears to be persistently high Rotate infusion sites  The following changes were made in the pump settings: Correction factor I: 25 except overnight Carbohydrate coverage between 12 PM and 11 PM    LIPIDS: To get reports from labs done by PCP  Given him information on benefits of the Covid vaccine and misconceptions about taking this, he is however refusing to take this since he thinks he is not going to get it anyhow.  Alex Tucker 01/27/2020, 9:41 AM   Note: This office note was prepared with Dragon voice recognition system technology. Any transcriptional errors that result from this process are unintentional.

## 2020-02-27 ENCOUNTER — Telehealth: Payer: Self-pay

## 2020-02-27 NOTE — Telephone Encounter (Signed)
PA has been initiated via [email or web address] for Dexcom G5 sensors.  Alex Tucker  Key: [id number] -  PA Case ID: [id number] -  Rx #: [id number] Status: Sent to [facility] Drug: Dexcom G6 Sensor Form: [employer] Electronic Prior Authorization Form (2017 NCPDP) Original Claim Info 64 PA Required call [phone] Requires Prior Authorization

## 2020-05-29 ENCOUNTER — Encounter: Payer: Self-pay | Admitting: Endocrinology

## 2020-05-29 ENCOUNTER — Ambulatory Visit (INDEPENDENT_AMBULATORY_CARE_PROVIDER_SITE_OTHER): Payer: 59 | Admitting: Endocrinology

## 2020-05-29 ENCOUNTER — Other Ambulatory Visit: Payer: Self-pay

## 2020-05-29 VITALS — BP 116/68 | HR 64 | Wt 167.0 lb

## 2020-05-29 DIAGNOSIS — E7800 Pure hypercholesterolemia, unspecified: Secondary | ICD-10-CM | POA: Diagnosis not present

## 2020-05-29 DIAGNOSIS — E1065 Type 1 diabetes mellitus with hyperglycemia: Secondary | ICD-10-CM

## 2020-05-29 DIAGNOSIS — Z23 Encounter for immunization: Secondary | ICD-10-CM

## 2020-05-29 LAB — COMPREHENSIVE METABOLIC PANEL
ALT: 29 U/L (ref 0–53)
AST: 21 U/L (ref 0–37)
Albumin: 4 g/dL (ref 3.5–5.2)
Alkaline Phosphatase: 89 U/L (ref 39–117)
BUN: 13 mg/dL (ref 6–23)
CO2: 30 mEq/L (ref 19–32)
Calcium: 9 mg/dL (ref 8.4–10.5)
Chloride: 103 mEq/L (ref 96–112)
Creatinine, Ser: 1.09 mg/dL (ref 0.40–1.50)
GFR: 79.55 mL/min (ref >60.00)
Glucose, Bld: 142 mg/dL — ABNORMAL HIGH (ref 70–99)
Potassium: 4 mEq/L (ref 3.5–5.1)
Sodium: 138 mEq/L (ref 135–145)
Total Bilirubin: 0.3 mg/dL (ref 0.2–1.2)
Total Protein: 7.3 g/dL (ref 6.0–8.3)

## 2020-05-29 LAB — MICROALBUMIN / CREATININE URINE RATIO
Creatinine,U: 88.2 mg/dL
Microalb Creat Ratio: 0.8 mg/g (ref 0.0–30.0)
Microalb, Ur: <0.7 mg/dL (ref 0.0–1.9)

## 2020-05-29 LAB — POCT GLYCOSYLATED HEMOGLOBIN (HGB A1C): Hemoglobin A1C: 7.7 % — AB (ref 4.0–5.6)

## 2020-05-29 NOTE — Patient Instructions (Addendum)
BASAL 1.2 FROM 3 AM TO 7 AM  Use temp target when more active  Split bolus for hi fat meals  [email or web address]
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
## 2020-06-22 ENCOUNTER — Other Ambulatory Visit: Payer: Self-pay | Admitting: Endocrinology

## 2020-09-28 ENCOUNTER — Encounter: Payer: Self-pay | Admitting: Endocrinology

## 2020-09-29 ENCOUNTER — Ambulatory Visit: Payer: 59 | Admitting: Endocrinology

## 2020-09-29 ENCOUNTER — Other Ambulatory Visit: Payer: Self-pay

## 2020-09-29 ENCOUNTER — Encounter: Payer: Self-pay | Admitting: Endocrinology

## 2020-09-29 ENCOUNTER — Ambulatory Visit (INDEPENDENT_AMBULATORY_CARE_PROVIDER_SITE_OTHER): Payer: 59 | Admitting: Endocrinology

## 2020-09-29 VITALS — BP 128/82 | HR 67 | Ht 64.0 in | Wt 173.2 lb

## 2020-09-29 DIAGNOSIS — E1065 Type 1 diabetes mellitus with hyperglycemia: Secondary | ICD-10-CM

## 2020-09-29 LAB — POCT GLYCOSYLATED HEMOGLOBIN (HGB A1C): Hemoglobin A1C: 8.1 % — AB (ref 4.0–5.6)

## 2020-09-29 MED ORDER — ATORVASTATIN CALCIUM 20 MG PO TABS: 20.0000 mg | ORAL_TABLET | Freq: Every day | ORAL | 1 refills | Status: DC

## 2020-09-29 NOTE — Progress Notes (Signed)
Patient ID: Alex Tucker, male   DOB: 06/25/1971, 50 y.o.   MRN: [id number]    Reason for Appointment: Follow-up of insulin-dependent diabetes   History of Present Illness:          Diagnosis: Type 1 diabetes mellitus, date of diagnosis:  2014      Past history:  He apparently had no symptoms at diagnosis and his blood sugar a year before at his physical was normal Details of his initial diagnosis are not available but he thinks his blood sugars were about 180 at that time with A1c around 9% He was initially tried on metformin but he could not tolerate this because of diarrhea Subsequently on his own he was trying to lose weight with diet and apparently lost 40 pounds around the end of 2014 His PCP started him on insulin in 12/14 approximately when his A1c was high and he was also losing weight. His initial consultation he was told to try Invokana and Victoza along with his basal insulin but he could not get Victoza because of non-coverage by his insurance.  Also he was having side effects of not feeling well and having some nausea with Invokana and stopped this in 8/15 after Also his Levemir was changed to Lantus insulin once a day; initially was started on 20 units   Recent history:   He had been on insulin pumps since 08/29/14 because of poor control with basal bolus insulin regimens; pre-pump pump A1c was >10%  Current insulin pump: T-Slim control IQ since 11/20/2018  PUMP settings: Basal rates  midnight = 0.95, 1 AM = 0.7, 4 AM = 1.1, 7 AM = 1. 30.  10 AM = 1.25, 1 PM = 1.4,, 8 PM = 1.8 and 11 PM = 1.4  Carbohydrate coverage 1:7 until 7 AM, 7 AM and 10 AM -  = 5.0, and 12 noon-11 PM = 1: 4 Sensitivity 1: 40 at midnight , 1: 30 from 7 AM,, 1: 25 at 2 PM, with target 120 Active insulin 4 hours  Overall average insulin use per day is about 70 units  Current management, blood sugar patterns and problems identified are as follows:  His A1c is 8.1    He did not change his  basal rate at 3 AM as recommended on the last visit  His highest blood sugars are still between 3 AM and 7 AM  The thinks is blood sugars were significantly high when he had COVID infection about a month ago even with a taking extra insulin  He states that he will have a significant low blood sugar overnight unless he has a bedtime snack which could be peanut butter crackers or a sandwich for which she will take a bolus sometimes  Otherwise generally blood sugars are fairly good when he is eating breakfast or lunch  He is usually activating his sleep more at about 1 AM and with this he is not getting any correction boluses  Blood sugar patterns from the sensor download are interpreted as follows   Blood sugars are showing high variability overnight and tendency to being above target for an average between 3 AM-7 AM but otherwise more consistently controlled especially midday and afternoon  POSTPRANDIAL readings are mostly controlled with only a couple of unusually high readings after dinner or late afternoon  He has sometimes an excessive rebound from low blood sugars are low normal readings  Overnight readings are quite variable with tendency to some hypoglycemia between 11  PM-1 AM but usually blood sugars are rising after 1 AM until 7 AM and generally averaging 180-200 at those times  Postprandial readings are excellent after most meals including breakfast and lunch  Hypoglycemia has been present mostly around midnight and rarely around 9 AM; today around 4 AM his blood sugar was 40 on his fingerstick which is unusual    CGM use % of time  97  2-week average/SD  156+/-62  Time in range        67%  % Time Above 180  23  % Time above 250 6  % Time Below 70 3      PRE-MEAL  overnight  mornings  afternoon  evening Overall  Glucose range:      45-369  Averages:  195+/-73  144+/-67  142  144     Previous blood sugars on CGM:  PRE-MEAL  12 AM-6 AM  6 AM-12 PM  12 PM-6 PM  6 PM  + Overall  Glucose range:       Mean/median:  191, was 174  129, was 139  181, was 162  174, was 174  169+/-71      Side effects from medications have been: Metformin causes diarrhea, Invokana causes nausea and malaise   Glycemic control:   Lab Results  Component Value Date   HGBA1C 7.7 (A) 05/29/2020   HGBA1C 7.8 (A) 01/27/2020   HGBA1C 7.9 (A) 10/28/2019   Lab Results  Component Value Date   MICROALBUR <0.7 05/29/2020   LDLCALC 17 01/06/2020   CREATININE 1.09 05/29/2020    Self-care: The diet that the patient has been following [id number] low fat  Meals: 3 meals per day. 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  Latest known visit with results is:  Office Visit on 05/29/2020  Component Date Value Ref Range Status  . Hemoglobin A1C 05/29/2020 7.7* 4.0 - 5.6 % Final  . Sodium 05/29/2020 138  135 - 145 mEq/L Final  . Potassium 05/29/2020 4.0  3.5 - 5.1 mEq/L Final  . Chloride 05/29/2020 103  96 - 112 mEq/L Final  . CO2 05/29/2020 30  19 - 32 mEq/L Final  . Glucose, Bld 05/29/2020 142* 70 - 99 mg/dL Final  . BUN 71/21/9758 13  6 - 23 mg/dL Final  . Creatinine, Ser 05/29/2020 1.09  0.40 - 1.50 mg/dL Final  . Total Bilirubin 05/29/2020 0.3  0.2 - 1.2 mg/dL Final  . Alkaline Phosphatase 05/29/2020 89  39 - 117 U/L Final  . AST 05/29/2020 21  0 - 37 U/L Final  . ALT 05/29/2020 29  0 - 53 U/L Final  . Total Protein 05/29/2020 7.3  6.0 - 8.3 g/dL Final  . Albumin 83/25/4982 4.0  3.5 - 5.2 g/dL Final  . GFR  64/15/8309 79.55  >60.00 mL/min Final  . Calcium 05/29/2020 9.0  8.4 -  10.5 mg/dL Final  . Microalb, Ur 26/94/8546 <0.7  0.0 - 1.9 mg/dL Final  . Creatinine,U 27/10/5007 88.2  mg/dL Final  . Microalb Creat Ratio 05/29/2020 0.8  0.0 - 30.0 mg/g Final    Allergies as of 09/29/2020      Reactions   Codeine Itching      Medication List       Accurate as of September 29, 2020  9:33 AM. If you have any questions, ask your nurse or doctor.        acetaminophen 325 MG tablet Commonly known as: TYLENOL Take 325-650 mg by mouth every 6 (six) hours as needed for moderate pain or headache.   aspirin-acetaminophen-caffeine 250-250-65 MG tablet Commonly known as: EXCEDRIN MIGRAINE Take 1 tablet by mouth every 8 (eight) hours as needed for headache.   atenolol 25 MG tablet Commonly known as: TENORMIN Take 50 mg by mouth at bedtime.   atorvastatin 20 MG tablet Commonly known as: LIPITOR Take 20 mg by mouth at bedtime.   citalopram 40 MG tablet Commonly known as: CELEXA Take 40 mg by mouth at bedtime.   Dexcom G6 Receiver Devi 1 each by Does not apply route daily. Use receiver to monitor blood glucose continuously.   Dexcom G6 Sensor Misc APPLY 1 SENSOR TO THE BODY EVERY 10 DAYS FOR CONTINUOUS BLOOD SUGAR MONITORING   Dexcom G6 Transmitter Misc USE TRANSMITTER WITH G6 SENSOR DAILY. CHANGE EVERY 90 DAYS   glucagon 1 MG Solr injection Commonly known as: GLUCAGEN Inject 1 mg into the vein once as needed for low blood sugar. Use as needed for hypoglycemia   ibuprofen 200 MG tablet Commonly known as: ADVIL Take 400 mg by mouth every 6 (six) hours as needed for headache or moderate pain.   insulin lispro 100 UNIT/ML injection Commonly known as: HumaLOG USE UP TO 100 UNITS DAILY WIHT INSULIN PUMP   levETIRAcetam 1000 MG tablet Commonly known as: KEPPRA Take 1,000 mg by mouth 2 (two) times daily.   methocarbamol 500 MG tablet Commonly known as: ROBAXIN Take 1 tablet (500 mg  total) by mouth every 8 (eight) hours as needed for muscle spasms.   naproxen 500 MG tablet Commonly known as: NAPROSYN Take 1 tablet (500 mg total) by mouth 2 (two) times daily with a meal.   omeprazole 20 MG capsule Commonly known as: PRILOSEC Take 40 mg by mouth at bedtime.   UNABLE TO FIND TANDEM INSULIN PUMP AND SUPPLIES       Allergies:  Allergies  Allergen Reactions  . Codeine Itching    Past Medical History:  Diagnosis Date  . Arthritis    knees, shoulder  . Diabetes mellitus without complication (HCC)    Type II per Dr Lucianne Muss  . GERD (gastroesophageal reflux disease)   . High cholesterol   . History of gout   . History of kidney stones    several  . IBS (irritable bowel syndrome)   . 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  [name] TBI (traumatic brain injury) [facility])     Past Surgical History:  Procedure Laterality Date  . HAND SURGERY Right    tendon repair- thumb right hand.  [name] SHOULDER ARTHROSCOPY  08/03/2017   Procedure: RIGHT SHOULDER  DIAGNOSTIC ARTHROSCOPY;  Surgeon: Vickki Hearing, MD;  Location: [facility];  Service: Orthopedics;;  . SHOULDER ARTHROSCOPY WITH LABRAL REPAIR Right 08/29/2017   Procedure: RIGHT SHOULDER  ARTHROSCOPY WITH DEBRIDEMENT, LABRAL REPAIR, BICEPS TENODESIS;  Surgeon: Cammy Copa, MD;  Location: [facility];  Service: Orthopedics;  Laterality: Right;    Family History  Problem Relation Age of Onset  . Diabetes Mother   . Diabetes Paternal Grandmother   . Diabetes Paternal Grandfather     Social History:  reports that he has been smoking cigarettes. He has a 15.00 pack-year smoking history. He has never used smokeless tobacco. He reports that he does not drink alcohol and does not use drugs.    Review of Systems       Hypercholesterolemia:    He was on Lipitor 20 mg, this was controlling his LDL below 100  He has not had a prescription from his PCP  because of not going back for follow-up      Lab Results  Component Value Date   CHOL 119 01/06/2020   HDL 32 (A) 01/06/2020   LDLCALC 17 01/06/2020   LDLDIRECT 148.0 03/26/2015   TRIG 85 01/06/2020   CHOLHDL 4 08/12/2019     He was in 10/21 advised on the need for protection against Covid infections that can be serious and the safety of the vaccination. He did not get vaccinated and did get infections with Covid earlier this year  Physical Examination:  BP 128/82   Pulse 67   Ht 5\' 4"  (1.626 m)   Wt 173 lb 3.2 oz (78.6 kg)   SpO2 98%   BMI 29.73 kg/m       ASSESSMENT/PLAN:   Diabetes, insulin-dependent on insulin pump  See history of present illness for review of his current blood sugar patterns, problems identified  and pump  Management  His overall control is fair with A1c higher at 8.1  Blood sugars are more difficult to control because of tendency to periodic low sugars overnight especially around midnight but most of the sugars being significantly high between at least 3-7 AM Unlikely that just having a bedtime snack will make his sugars high after 3 AM although not clear why he had a low sugar episode at 3:40 AM today More recently his blood sugars are averaging 156 which would provide a lower A1c reading  The following changes were recommended:  Basal rate changes: 12 AM = 0.85, 1 AM = 0.80, 10 PM = 1.7 and 11 PM = 1.2 Discontinue use of the sleep mode for now and see if blood sugars are more evenly controlled Additional boluses when using many high fat meals or getting inadequate coverage for meals Start on the exercise activity when more physically active Also set up a new profile pattern for sick days with basal rate at least 0.5 higher to use if he has any acute illness Follow-up in 3 months  Restart atorvastatin and follow-up lipids on the next visit   There are no Patient Instructions on file for this visit.  09/29/2020, 9:33 AM   Note:  This office note was prepared with Dragon voice recognition system technology. Any transcriptional errors that result from this process are unintentional.

## 2020-09-30 ENCOUNTER — Telehealth: Payer: Self-pay | Admitting: Nutrition

## 2020-09-30 NOTE — Telephone Encounter (Signed)
Left message on voice mail to call me and discussed importance of keep the T-connect app on, so that readings are continually sent to the cloud.  Told him to call me to discuss if we need to link a new pump, and this is why he is not connected

## 2020-10-28 ENCOUNTER — Other Ambulatory Visit: Payer: Self-pay | Admitting: Endocrinology

## 2020-11-16 ENCOUNTER — Other Ambulatory Visit: Payer: Self-pay | Admitting: *Deleted

## 2020-11-16 DIAGNOSIS — E1065 Type 1 diabetes mellitus with hyperglycemia: Secondary | ICD-10-CM

## 2020-11-16 MED ORDER — DEXCOM G6 TRANSMITTER MISC: 1 refills | Status: DC

## 2020-11-28 ENCOUNTER — Other Ambulatory Visit: Payer: Self-pay | Admitting: Endocrinology

## 2020-12-28 ENCOUNTER — Ambulatory Visit: Payer: 59 | Admitting: Endocrinology

## 2021-02-01 ENCOUNTER — Telehealth: Payer: Self-pay | Admitting: Pharmacy Technician

## 2021-02-01 NOTE — Telephone Encounter (Addendum)
Patient Advocate Encounter   Received notification from [employer] that prior authorization for [facility] G6 SENSOR is required.   PA submitted on 02/01/2021 Key [id number] Status is APPROVED [facility] 02/01/2022 [id number]    [facility] will continue to follow.   Netty Starring. Dimas Aguas, CPhT Patient Advocate [facility] Phone: [phone] Fax:  [phone]

## 2021-02-17 ENCOUNTER — Other Ambulatory Visit: Payer: Self-pay

## 2021-02-17 ENCOUNTER — Ambulatory Visit (INDEPENDENT_AMBULATORY_CARE_PROVIDER_SITE_OTHER): Payer: 59 | Admitting: Endocrinology

## 2021-02-17 ENCOUNTER — Encounter: Payer: Self-pay | Admitting: Endocrinology

## 2021-02-17 VITALS — BP 120/76 | HR 64 | Ht 64.0 in | Wt 168.8 lb

## 2021-02-17 DIAGNOSIS — E7800 Pure hypercholesterolemia, unspecified: Secondary | ICD-10-CM

## 2021-02-17 DIAGNOSIS — E1065 Type 1 diabetes mellitus with hyperglycemia: Secondary | ICD-10-CM | POA: Diagnosis not present

## 2021-02-17 LAB — POCT GLYCOSYLATED HEMOGLOBIN (HGB A1C): Hemoglobin A1C: 7.6 % — AB (ref 4.0–5.6)

## 2021-02-17 MED ORDER — ATORVASTATIN CALCIUM 20 MG PO TABS: 20.0000 mg | ORAL_TABLET | Freq: Every day | ORAL | 1 refills | Status: DC

## 2021-02-17 NOTE — Progress Notes (Signed)
Patient ID: Alex Tucker, male   DOB: 01-23-1971, 50 y.o.   MRN: [id number]    Reason for Appointment: Follow-up of insulin-dependent diabetes   History of Present Illness:          Diagnosis: Type 1 diabetes mellitus, date of diagnosis:  2014      Past history:  He apparently had no symptoms at diagnosis and his blood sugar a year before at his physical was normal Details of his initial diagnosis are not available but he thinks his blood sugars were about 180 at that time with A1c around 9% He was initially tried on metformin but he could not tolerate this because of diarrhea Subsequently on his own he was trying to lose weight with diet and apparently lost 40 pounds around the end of 2014 His PCP started him on insulin in 12/14 approximately when his A1c was high and he was also losing weight. His initial consultation he was told to try Invokana and Victoza along with his basal insulin but he could not get Victoza because of non-coverage by his insurance.  Also he was having side effects of not feeling well and having some nausea with Invokana and stopped this in 8/15 after Also his Levemir was changed to Lantus insulin once a day; initially was started on 20 units   Recent history:   He had been on insulin pumps since 08/29/14 because of poor control with basal bolus insulin regimens; pre-pump pump A1c was >10%  Current insulin pump: T-Slim control IQ since 11/20/2018  PUMP settings: Basal rates  midnight = 0.85, 1 AM = 0.8, 4 AM = 1.1, 7 AM = 1. 30.  10 AM = 1.25, 1 PM = 1.4,, 8 PM = 1.8, 10 PM = 1.7 and 11 PM = 1.2  Carbohydrate coverage 1:7 until 7 AM, 7 AM and 10 AM -  = 5.0, and 12 noon-11 PM = 1: 4 Sensitivity 1: 30 at midnight , 1: 25 from 7 AM, until 11 PM, with target 120 Active insulin 4 hours  Total average insulin use per day is about 83 units  Current management, blood sugar patterns and problems identified are as follows:  His A1c is 7.6 compared to  8.1  Although his A1c is better and his recent time in range is 63% compared to 67 previously  He appears to have multiple basal rates throughout the day and night although midnight basal rate was reduced as recommended  He thinks his blood sugars will go low around 3 AM especially after work and he will have a snack at bedtime around midnight However this may or may not be covered with any bolus insulin even if it is more than 15 g carbohydrate per He does not think he is getting low sugars while at work when he is walking constantly although this is not a continuous walking He was advised to turn off his sleep mode at night since he sometimes has significantly high readings but he was not aware of how to do this and this is still active Sensitivity is 1: 25 during the day and 1: 30 at night He still has difficulty with consistent control after meals with some days he will have marked increase in blood sugars. Also not clear why his blood sugars are highly variable late evening and overnight. He does try to increase his bolus sometimes when he is not getting adequate correction and 43% of his boluses are overridden HYPOGLYCEMIA has been  minimal but once this was preceded by a large 10 units correction bolus late evening  Blood sugar patterns from the sensor download are interpreted as follows  Blood sugars are again showing high variability overnight and late evening after 9 PM.  Also on an average these are the highest times of the day During the day his blood sugars are generally better controlled with average within the target range although sometimes going up around midday POSTPRANDIAL readings are variably controlled with occasional significantly high readings in the evening after dinner Also may sometimes have a high reading around midnight Although in the last few days blood sugars are relatively better he will sometimes have persistently high readings through the night  Overnight  readings are generally higher but not as much in the last few days and has a couple of nights with the blood sugars that have been excellent and stable Postprandial readings are generally better controlled after breakfast and lunch and usually does not get low sugars after boluses Hypoglycemia has been only sporadic during the night or midday and around 6 PM     CGM use % of time   2-week average/SD 172  Time in range       63 %  % Time Above 180 22  % Time above 250 13  % Time Below 70 1      PRE-MEAL  overnight  mornings  afternoon  evening Overall  Glucose range:       Averages: 204+/-92 150+/-57 150+/-38 180+/-79    PREVIOUSLY:   CGM use % of time  97  2-week average/SD  156+/-62  Time in range        67%  % Time Above 180  23  % Time above 250 6  % Time Below 70 3      PRE-MEAL  overnight  mornings  afternoon  evening Overall  Glucose range:      45-369  Averages:  195+/-73  144+/-67  142  144     Side effects from medications have been: Metformin causes diarrhea, Invokana causes nausea and malaise   Glycemic control:   Lab Results  Component Value Date   HGBA1C 7.6 (A) 02/17/2021   HGBA1C 8.1 (A) 09/29/2020   HGBA1C 7.7 (A) 05/29/2020   Lab Results  Component Value Date   MICROALBUR <0.7 05/29/2020   LDLCALC 17 01/06/2020   CREATININE 1.09 05/29/2020    Self-care: The diet that the patient has been following AT:[facility] low fat  Meals: 3 meals per day. 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 If you have any questions, ask your nurse or doctor.          acetaminophen 325 MG tablet Commonly known as: TYLENOL Take 325-650 mg by mouth every 6 (six) hours as needed for moderate pain or headache.   aspirin-acetaminophen-caffeine 250-250-65 MG tablet Commonly known as: EXCEDRIN MIGRAINE Take 1 tablet by mouth every 8 (eight) hours as needed for headache.   atenolol 25 MG tablet Commonly known as: TENORMIN Take 50 mg by mouth at bedtime.   atorvastatin 20 MG tablet Commonly known as: LIPITOR Take 1 tablet (20 mg total) by mouth at bedtime.   citalopram 40 MG tablet Commonly known as: CELEXA Take 40 mg by mouth at bedtime.   Dexcom G6 Receiver Devi 1 each by Does not apply route daily. Use receiver to monitor blood glucose continuously.   Dexcom G6 Sensor Misc APPLY 1 SENSOR TO THE BODY EVERY 10 DAYS FOR CONTINUOUS BLOOD SUGAR MONITORING   Dexcom G6 Transmitter Misc USE TRANSMITTER WITH G6 SENSOR DAILY. CHANGE EVERY 90 DAYS. E10.65   glucagon 1 MG Solr injection Commonly known as: GLUCAGEN Inject 1 mg into the vein once as needed for low blood sugar. Use as needed for hypoglycemia   ibuprofen 200 MG tablet Commonly known as: ADVIL Take 400 mg by mouth every 6 (six) hours as needed for headache or moderate pain.   insulin lispro 100 UNIT/ML injection Commonly known as: HumaLOG USE UP TO 100 UNITS DAILY WIHT INSULIN PUMP   levETIRAcetam 1000 MG tablet Commonly known as: KEPPRA Take 1,000 mg by mouth 2 (two) times daily.   methocarbamol 500 MG tablet Commonly known as: ROBAXIN Take 1 tablet (500 mg total) by mouth every 8 (eight) hours as needed for muscle spasms.   metoprolol tartrate 25 MG tablet Commonly known as: LOPRESSOR Take 25 mg  by mouth 2 (two) times daily.   naproxen 500 MG tablet Commonly known as: NAPROSYN Take 1 tablet (500 mg total) by mouth 2 (two) times daily with a meal.   omeprazole 20 MG capsule Commonly known as: PRILOSEC Take 40 mg by mouth at bedtime.   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   TBI (traumatic brain injury) [facility])     Past Surgical History:  Procedure Laterality Date   HAND SURGERY Right    tendon repair- thumb right hand.   SHOULDER ARTHROSCOPY  08/03/2017   Procedure: RIGHT SHOULDER  DIAGNOSTIC ARTHROSCOPY;  Surgeon: Vickki Hearing, MD;  Location: [facility];  Service: Orthopedics;;   SHOULDER ARTHROSCOPY WITH LABRAL REPAIR Right 08/29/2017   Procedure: RIGHT SHOULDER ARTHROSCOPY WITH DEBRIDEMENT, LABRAL REPAIR, BICEPS TENODESIS;  Surgeon: Cammy Copa, MD;  Location: [facility];  Service: Orthopedics;  Laterality: Right;    Family History  Problem Relation Age of Onset   Diabetes Mother  Diabetes Paternal Grandmother    Diabetes Paternal Grandfather     Social History:  reports that he has been smoking cigarettes. He has a 15.00 pack-year smoking history. He has never used smokeless tobacco. He reports that he does not drink alcohol and does not use drugs.    Review of Systems       Hypercholesterolemia:    He was on Lipitor 20 mg, this was controlling his LDL below 100. He still has not taken this regularly and has not seen his PCP for this        Lab Results   Component Value Date   CHOL 119 01/06/2020   HDL 32 (A) 01/06/2020   LDLCALC 17 01/06/2020   LDLDIRECT 148.0 03/26/2015   TRIG 85 01/06/2020   CHOLHDL 4 08/12/2019     He was in 10/21 advised on the need for protection against Covid infections that can be serious and the safety of the vaccination. He did not get vaccinated and did get infections with Covid earlier this year  Physical Examination:  BP 120/76   Pulse 64   Ht 5\' 4"  (1.626 m)   Wt 168 lb 12.8 oz (76.6 kg)   SpO2 97%   BMI 28.97 kg/m       ASSESSMENT/PLAN:   Diabetes, insulin-dependent on insulin pump  See history of present illness for review of his current blood sugar patterns, problems identified  and pump  Management  His overall control is fair with A1c 7.6 although improved  Blood sugars are quite variable overnight but has less tendency to low sugars and rebound compared to last visit Blood sugar may be higher overnight partly because of having variable snack at bedtime that not covered with boluses for larger intake Also sometimes his postprandial readings are higher than expected, possibly from higher fat intake Physical activity does not cause any low sugars at work  Discussed that he will likely get better if correction of high readings overnight if he turns off to sleep mode and this was done under supervision today No change in settings as yet but may consider 1: 30 correction during the night also If his blood sugars are consistently running high he will need to do manual correction boluses sooner  As before he will need additional boluses when eating high fat meals Also he will again try to use the upper arm as he does not find the sites on his abdomen consistently effective  Recommended some programmed exercise to help with insulin resistance  Restart atorvastatin, new prescription sent and follow-up lipids on the next visit   There are no Patient Instructions on file for this  visit.  Reather LittlerAjay Arnell Slivinski 02/17/2021, 9:13 PM   Note: This office note was prepared with Dragon voice recognition system technology. Any transcriptional errors that result from this process are unintentional.

## 2021-05-27 ENCOUNTER — Other Ambulatory Visit: Payer: Self-pay | Admitting: Endocrinology

## 2021-05-27 DIAGNOSIS — E1065 Type 1 diabetes mellitus with hyperglycemia: Secondary | ICD-10-CM

## 2021-06-11 ENCOUNTER — Other Ambulatory Visit: Payer: Self-pay | Admitting: Endocrinology

## 2021-06-11 MED ORDER — METOPROLOL TARTRATE 25 MG PO TABS: 25.0000 mg | ORAL_TABLET | Freq: Two times a day (BID) | ORAL | 0 refills | Status: AC

## 2021-06-14 ENCOUNTER — Encounter: Payer: Self-pay | Admitting: Endocrinology

## 2021-06-15 ENCOUNTER — Other Ambulatory Visit: Payer: Self-pay

## 2021-06-15 ENCOUNTER — Encounter: Payer: Self-pay | Admitting: Endocrinology

## 2021-06-15 ENCOUNTER — Ambulatory Visit (INDEPENDENT_AMBULATORY_CARE_PROVIDER_SITE_OTHER): Payer: 59 | Admitting: Endocrinology

## 2021-06-15 VITALS — BP 126/80 | HR 73 | Ht 64.0 in | Wt 167.0 lb

## 2021-06-15 DIAGNOSIS — Z23 Encounter for immunization: Secondary | ICD-10-CM | POA: Diagnosis not present

## 2021-06-15 DIAGNOSIS — E7800 Pure hypercholesterolemia, unspecified: Secondary | ICD-10-CM | POA: Diagnosis not present

## 2021-06-15 DIAGNOSIS — E1065 Type 1 diabetes mellitus with hyperglycemia: Secondary | ICD-10-CM | POA: Diagnosis not present

## 2021-06-15 LAB — COMPREHENSIVE METABOLIC PANEL
ALT: 21 U/L (ref 0–53)
AST: 18 U/L (ref 0–37)
Albumin: 4.2 g/dL (ref 3.5–5.2)
Alkaline Phosphatase: 73 U/L (ref 39–117)
BUN: 16 mg/dL (ref 6–23)
CO2: 29 mEq/L (ref 19–32)
Calcium: 9.3 mg/dL (ref 8.4–10.5)
Chloride: 103 mEq/L (ref 96–112)
Creatinine, Ser: 1.12 mg/dL (ref 0.40–1.50)
GFR: 77.11 mL/min (ref >60.00)
Glucose, Bld: 151 mg/dL — ABNORMAL HIGH (ref 70–99)
Potassium: 3.8 mEq/L (ref 3.5–5.1)
Sodium: 138 mEq/L (ref 135–145)
Total Bilirubin: 0.2 mg/dL (ref 0.2–1.2)
Total Protein: 7.3 g/dL (ref 6.0–8.3)

## 2021-06-15 LAB — POCT GLYCOSYLATED HEMOGLOBIN (HGB A1C): Hemoglobin A1C: 7.6 % — AB (ref 4.0–5.6)

## 2021-06-15 LAB — LIPID PANEL
Cholesterol: 156 mg/dL (ref 0–200)
HDL: 41.4 mg/dL (ref >39.00)
LDL Cholesterol: 84 mg/dL (ref 0–99)
NonHDL: 114.67
Total CHOL/HDL Ratio: 4
Triglycerides: 151 mg/dL — ABNORMAL HIGH (ref 0.0–149.0)
VLDL: 30.2 mg/dL (ref 0.0–40.0)

## 2021-06-15 LAB — MICROALBUMIN / CREATININE URINE RATIO
Creatinine,U: 84.6 mg/dL
Microalb Creat Ratio: 0.8 mg/g (ref 0.0–30.0)
Microalb, Ur: <0.7 mg/dL (ref 0.0–1.9)
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
## 2021-06-29 ENCOUNTER — Ambulatory Visit
Admission: EM | Admit: 2021-06-29 | Discharge: 2021-06-29 | Disposition: A | Discharge status: Home / Self Care | Payer: 59 | Attending: Student | Admitting: Student

## 2021-06-29 ENCOUNTER — Other Ambulatory Visit: Payer: Self-pay

## 2021-06-29 DIAGNOSIS — S61214A Laceration without foreign body of right ring finger without damage to nail, initial encounter: Secondary | ICD-10-CM

## 2021-06-29 DIAGNOSIS — E108 Type 1 diabetes mellitus with unspecified complications: Secondary | ICD-10-CM | POA: Diagnosis not present

## 2021-06-29 DIAGNOSIS — E109 Type 1 diabetes mellitus without complications: Secondary | ICD-10-CM

## 2021-06-29 MED ORDER — CEPHALEXIN 500 MG PO CAPS: 500.0000 mg | ORAL_CAPSULE | Freq: Four times a day (QID) | ORAL | 0 refills | Status: AC
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
## 2021-06-29 NOTE — ED Triage Notes (Signed)
Pt presents with laceration in right ring finger., States happened 1-2 hrs ago when he was working with wood. Denies numbness, tingling.

## 2021-06-29 NOTE — Discharge Instructions (Addendum)
-  Start the antibiotic: Keflex, 4x daily x5 days. You can take this with food if you have a sensitive stomach. -Starting tomorrow, wash the wound with gentle soap and water 1-2 times daily.  -Finger splint for the next 2-3 days -Follow-up in 10 days for suture removal.

## 2021-09-01 ENCOUNTER — Other Ambulatory Visit: Payer: Self-pay

## 2021-09-01 DIAGNOSIS — E1065 Type 1 diabetes mellitus with hyperglycemia: Secondary | ICD-10-CM

## 2021-09-01 MED ORDER — INSULIN LISPRO 100 UNIT/ML IJ SOLN: INTRAMUSCULAR | 3 refills | Status: DC

## 2021-10-04 ENCOUNTER — Other Ambulatory Visit: Payer: Self-pay

## 2021-10-04 ENCOUNTER — Other Ambulatory Visit: Payer: Self-pay | Admitting: Endocrinology

## 2021-10-04 ENCOUNTER — Telehealth: Payer: Self-pay

## 2021-10-04 DIAGNOSIS — E1065 Type 1 diabetes mellitus with hyperglycemia: Secondary | ICD-10-CM

## 2021-10-04 MED ORDER — INSULIN ASPART 100 UNIT/ML IJ SOLN
INTRAMUSCULAR | 99 refills | Status: AC
Start: 2021-10-04 — End: ?

## 2021-10-04 NOTE — Telephone Encounter (Signed)
Per pharmacy insurance does not cover hurmalog but will cover novolog. Is it ok to send Rx in?

## 2021-10-05 ENCOUNTER — Other Ambulatory Visit: Payer: Self-pay

## 2021-10-05 DIAGNOSIS — E1065 Type 1 diabetes mellitus with hyperglycemia: Secondary | ICD-10-CM

## 2021-10-05 NOTE — Telephone Encounter (Signed)
Alex Tucker has released labs

## 2021-10-11 ENCOUNTER — Other Ambulatory Visit (HOSPITAL_COMMUNITY): Payer: Self-pay

## 2021-10-21 ENCOUNTER — Ambulatory Visit: Payer: Self-pay | Admitting: Endocrinology

## 2021-10-21 NOTE — Telephone Encounter (Signed)
Patient called - thought his visit for 10/21/21 was virtual, when advised it was not - visit had to be rescheduled to another day so that patient could do labs and connect T-Slim for pump readings.  Patient rescheduled to 11/08/21 @ 230 PM - he is going to [facility] today to do labs and is connected for pump uploads through T-Slim program.    ? ?Patient has first visit with new PCP in his area on 11/01/21. ?

## 2021-10-21 NOTE — Progress Notes (Unsigned)
Patient ID: Alex Tucker, male   DOB: 25-Apr-1971, 51 y.o.   MRN: [id number]    Reason for Appointment: Follow-up of insulin-dependent diabetes   History of Present Illness:          Diagnosis: Type 1 diabetes mellitus, date of diagnosis:  2014      Past history:  He apparently had no symptoms at diagnosis and his blood sugar a year before at his physical was normal Details of his initial diagnosis are not available but he thinks his blood sugars were about 180 at that time with A1c around 9% He was initially tried on metformin but he could not tolerate this because of diarrhea Subsequently on his own he was trying to lose weight with diet and apparently lost 40 pounds around the end of 2014 His PCP started him on insulin in 12/14 approximately when his A1c was high and he was also losing weight. His initial consultation he was told to try Invokana and Victoza along with his basal insulin but he could not get Victoza because of non-coverage by his insurance.  Also he was having side effects of not feeling well and having some nausea with Invokana and stopped this in 8/15 after Also his Levemir was changed to Lantus insulin once a day; initially was started on 20 units   Recent history:   He had been on insulin pumps since 08/29/14 because of poor control with basal bolus insulin regimens; pre-pump pump A1c was >10%  Current insulin pump: T-Slim control IQ since 11/20/2018  PUMP settings: Basal rates  midnight = 0.85, 1 AM = 0.8, 4 AM = 1.1, 7 AM = 1. 30.  10 AM = 1.25, 1 PM = 1.4,, 8 PM = 1.8, 10 PM = 1.7 and 11 PM = 1.2  Carbohydrate coverage 1:7 until 7 AM, 7 AM and 10 AM -  = 5.0, and 12 noon-11 PM = 1: 4 Sensitivity 1: 30 at midnight , 1: 25 from 7 AM, until 11 PM, with target 120 Active insulin 4 hours  Total average insulin use per day is up to 90 units  Current management, blood sugar patterns and problems identified are as follows:  His A1c is 7.6 again  Although  his A1c is better and his recent time in range is still the same at 62, was 63%  previously  He still has multiple basal rates throughout the day and night  He is usually eating a peanut butter sandwich around 12:30 AM to keep his sugars from getting low Last night since he did not eat a snack his sugar got significantly low before 2 AM  With starting off his sleep mode however his blood sugars are not doing as high at night  However his correction factor may not be adequate and sometimes he has persistently high readings especially in the afternoons and evenings  Generally appears to be having high readings periodically between supper and bedtime and not clear if this is related to high fat meals which he may sometimes have an eating out  Although he is walking quite a bit at work this is not all at 1 time and this did not cause low sugars  Does not appear to have any low sugars during the day usually  Blood sugar patterns from the sensor download are interpreted as follows  The highest blood sugars overall are either late afternoon or late evening Overnight blood sugars are not as high compared to the last  visit Overnight blood sugars are generally well controlled with only occasional high sugars, if he has low episodes the sugars may rebound excessively Postprandial readings are usually controlled after breakfast but periodically will go significantly high after lunch or dinner but no consistent pattern, he is not getting low sugars from boluses Hypoglycemia has been occurring occasionally between about 2 AM-5:30 AM and mostly in the last few days, occasionally blood sugar may be low normal around 7 PM   CGM use % of time   2-week average/SD 170/65  Time in range      62  %  % Time Above 180 22  % Time above 250 14  % Time Below 70 2      PRE-MEAL  overnight  mornings  afternoon  evening Overall  Glucose range:       Averages: 158 146 197 192+/-57    Previously:   CGM use % of  time   2-week average/SD 172  Time in range       63 %  % Time Above 180 22  % Time above 250 13  % Time Below 70 1      PRE-MEAL  overnight  mornings  afternoon  evening Overall  Glucose range:       Averages: 204+/-92 150+/-57 150+/-38 180+/-79      Side effects from medications have been: Metformin causes diarrhea, Invokana causes nausea and malaise   Glycemic control:   Lab Results  Component Value Date   HGBA1C 7.6 (A) 06/15/2021   HGBA1C 7.6 (A) 02/17/2021   HGBA1C 8.1 (A) 09/29/2020   Lab Results  Component Value Date   MICROALBUR <0.7 06/15/2021   LDLCALC 84 06/15/2021   CREATININE 1.12 06/15/2021    Self-care: The diet that the patient has been following [id number] low fat  Meals: 3 meals per day. 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  Latest known visit with results is:  Office Visit on 06/15/2021  Component Date Value Ref Range Status   Hemoglobin A1C 06/15/2021 7.6 (A)  4.0 - 5.6 % Final   Cholesterol 06/15/2021 156  0 - 200 mg/dL Final   ATP III Classification       Desirable:  < 200 mg/dL               Borderline High:  200 - 239 mg/dL          High:  > = 226 mg/dL   Triglycerides 33/35/4562 151.0 (H)  0.0 - 149.0 mg/dL Final   Normal:  <563 mg/dLBorderline High:  150 - 199 mg/dL   HDL 89/37/3428 76.81  >39.00 mg/dL Final   VLDL 15/72/6203 30.2  0.0 - 40.0 mg/dL Final   LDL Cholesterol 06/15/2021 84  0 - 99 mg/dL Final   Total CHOL/HDL Ratio 06/15/2021 4   Final  Men          Women1/2 Average Risk     3.4          3.3Average Risk          5.0          4.42X Average Risk          9.6          7.13X Average Risk          15.0          11.0                       NonHDL 06/15/2021 114.67   Final   NOTE:  Non-HDL goal should be 30 mg/dL higher than patient's LDL goal (i.e. LDL goal of < 70 mg/dL, would have non-HDL goal of < 100 mg/dL)   Microalb, Ur 16/10/960410/25/2022 <0.7  0.0 - 1.9 mg/dL Final   Creatinine,U 54/09/811910/25/2022 84.6  mg/dL Final   Microalb Creat Ratio 06/15/2021 0.8  0.0 - 30.0 mg/g Final   Sodium 06/15/2021 138  135 - 145 mEq/L Final   Potassium 06/15/2021 3.8  3.5 - 5.1 mEq/L Final   Chloride 06/15/2021 103  96 - 112 mEq/L Final   CO2 06/15/2021 29  19 - 32 mEq/L Final   Glucose, Bld 06/15/2021 151 (H)  70 - 99 mg/dL Final   BUN 14/78/295610/25/2022 16  6 - 23 mg/dL Final   Creatinine, Ser 06/15/2021 1.12  0.40 - 1.50 mg/dL Final   Total Bilirubin 06/15/2021 0.2  0.2 - 1.2 mg/dL Final   Alkaline Phosphatase 06/15/2021 73  39 - 117 U/L Final   AST 06/15/2021 18  0 - 37 U/L Final   ALT 06/15/2021 21  0 - 53 U/L Final   Total Protein 06/15/2021 7.3  6.0 - 8.3 g/dL Final   Albumin 21/30/865710/25/2022 4.2  3.5 - 5.2 g/dL Final   GFR 84/69/629510/25/2022 77.11  >60.00 mL/min Final   Calculated using the CKD-EPI Creatinine Equation (2021)   Calcium 06/15/2021 9.3  8.4 - 10.5 mg/dL Final    Allergies as of 10/21/2021       Reactions   Codeine Itching        Medication List        Accurate as of October 21, 2021  8:16 AM. If you have any questions, ask your nurse or doctor.          acetaminophen 325 MG tablet Commonly known as: TYLENOL Take 325-650 mg by mouth every 6 (six) hours as needed for moderate pain or headache.   aspirin-acetaminophen-caffeine 250-250-65 MG tablet Commonly known as: EXCEDRIN MIGRAINE Take 1 tablet by mouth every 8 (eight) hours as needed for headache.   atorvastatin 20 MG tablet Commonly known as: LIPITOR Take 1 tablet (20 mg total) by  mouth at bedtime.   cephALEXin 500 MG capsule Commonly known as: KEFLEX Take 1 capsule (500 mg total) by mouth 4 (four) times daily.   citalopram 40 MG tablet Commonly known as: CELEXA Take 40 mg by mouth at bedtime.   Dexcom G6 Receiver Devi 1 each by Does not apply route daily. Use receiver to monitor blood glucose continuously.   Dexcom G6 Sensor Misc APPLY 1 SENSOR TO THE BODY EVERY 10 DAYS FOR CONTINUOUS BLOOD SUGAR MONITORING   Dexcom G6 Transmitter Misc USE TRANSMITTER WITH G6 SENSOR DAILY   glucagon 1 MG Solr injection Commonly known as: GLUCAGEN Inject 1 mg into  the vein once as needed for low blood sugar. Use as needed for hypoglycemia   ibuprofen 200 MG tablet Commonly known as: ADVIL Take 400 mg by mouth every 6 (six) hours as needed for headache or moderate pain.   insulin aspart 100 UNIT/ML injection Commonly known as: novoLOG USE UP TO 100 UNITS DAILY WITH INSULIN PUMP   levETIRAcetam 1000 MG tablet Commonly known as: KEPPRA Take 1,000 mg by mouth 2 (two) times daily.   methocarbamol 500 MG tablet Commonly known as: ROBAXIN Take 1 tablet (500 mg total) by mouth every 8 (eight) hours as needed for muscle spasms.   metoprolol tartrate 25 MG tablet Commonly known as: LOPRESSOR Take 1 tablet (25 mg total) by mouth 2 (two) times daily.   naproxen 500 MG tablet Commonly known as: NAPROSYN Take 1 tablet (500 mg total) by mouth 2 (two) times daily with a meal.   omeprazole 20 MG capsule Commonly known as: PRILOSEC Take 40 mg by mouth at bedtime.   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   TBI (traumatic brain injury)     Past Surgical History:  Procedure Laterality Date   HAND SURGERY Right    tendon repair- thumb right hand.   SHOULDER ARTHROSCOPY  08/03/2017   Procedure: RIGHT SHOULDER  DIAGNOSTIC ARTHROSCOPY;  Surgeon: Vickki Hearing, MD;  Location: [facility];  Service: Orthopedics;;   SHOULDER ARTHROSCOPY WITH LABRAL REPAIR Right 08/29/2017   Procedure: RIGHT SHOULDER ARTHROSCOPY WITH DEBRIDEMENT, LABRAL REPAIR, BICEPS TENODESIS;  Surgeon: Cammy Copa, MD;  Location: [facility];  Service: Orthopedics;  Laterality: Right;    Family History  Problem Relation Age of Onset   Diabetes Mother    Diabetes Paternal Grandmother    Diabetes Paternal Grandfather     Social History:  reports that he has been smoking cigarettes. He has a 15.00 pack-year smoking history. He has never used smokeless tobacco. He reports that he does not drink alcohol and does not use drugs.    Review of Systems       Hypercholesterolemia:    He was on Lipitor 20 mg, this was controlling his LDL below 100.  New prescription was sent on the last visit        Lab Results  Component Value Date   CHOL 156 06/15/2021   HDL 41.40 06/15/2021   LDLCALC 84 06/15/2021   LDLDIRECT 148.0 03/26/2015   TRIG 151.0 (H) 06/15/2021   CHOLHDL 4 06/15/2021    He says he has regular exam but no reports available  Physical Examination:  There were no vitals taken for this visit.     Diabetic Foot Exam - Simple   No data filed    No pedal edema  ASSESSMENT/PLAN:   Diabetes, insulin-dependent on insulin pump  See history of present illness for review of his current blood sugar patterns,  problems identified  and pump  Management  His overall control is fair with A1c 7.6   Blood sugars are still not within the target range sufficiently and 62% only currently   As above his blood sugar do show a tendency to low sugars overnight for which he is having a sandwich around bedtime indicating excessive basal activity  However overnight blood sugars overall are better controlled with avoiding sleep mode Most likely has high readings after certain meals at lunch and dinner from eating out or higher fat intake  He already has a carb ratio 1:4 at suppertime Also may sometimes have persistently high readings that are not being corrected adequately by control IQ He has occasionally taken correction boluses but likely not sufficiently  Recommendations: As before he needs to add extra insulin, 2 to 4 units at least for any higher fat meals especially at restaurants He may need correction postprandially at times especially after lunch and dinner Correction factor I: 20 between noon and 10 PM instead of 25 Bolus before starting to eat as much 15 minutes before eating if able to estimate his intake BASAL rates will be reduced down to 0.7 from midnight till 4 AM and 0.95 from 4 AM-5 AM.  Basal rates between 8 PM and 10 PM will be 1.9 and at 10 PM = 1.8 Especially if he sees his blood sugars rising early part of the night after midnight he can cut back on the amount of carbohydrate for his bedtime snack Encouraged him to start walking regularly on his days off  Recheck lipids to see if his atorvastatin is adequate Microalbumin today  To have eye exam done and report forwarded to us    There are no Patient Instructions on file for this visit.  Reather LittlerAjay Kaveon Blatz 10/21/2021, 8:16 AM   Note: This office note was prepared with Dragon voice recognition system technology. Any transcriptional errors that result from this process are unintentional.   Addendum: Labs at target on normal

## 2021-11-02 LAB — HEMOGLOBIN A1C
Est. average glucose Bld gHb Est-mCnc: 192 mg/dL
Hgb A1c MFr Bld: 8.3 % — ABNORMAL HIGH (ref 4.8–5.6)

## 2021-11-08 ENCOUNTER — Telehealth (INDEPENDENT_AMBULATORY_CARE_PROVIDER_SITE_OTHER): Payer: BC Managed Care – PPO | Admitting: Endocrinology

## 2021-11-08 ENCOUNTER — Encounter: Payer: Self-pay | Admitting: Endocrinology

## 2021-11-08 ENCOUNTER — Other Ambulatory Visit: Payer: Self-pay

## 2021-11-08 VITALS — Wt 175.0 lb

## 2021-11-08 DIAGNOSIS — E1065 Type 1 diabetes mellitus with hyperglycemia: Secondary | ICD-10-CM

## 2021-11-08 DIAGNOSIS — E7800 Pure hypercholesterolemia, unspecified: Secondary | ICD-10-CM | POA: Diagnosis not present

## 2021-11-08 MED ORDER — ATORVASTATIN CALCIUM 20 MG PO TABS: 20.0000 mg | ORAL_TABLET | Freq: Every day | ORAL | 1 refills | Status: AC

## 2021-11-08 MED ORDER — OZEMPIC (0.25 OR 0.5 MG/DOSE) 2 MG/3ML ~~LOC~~ SOPN: 0.5000 mg | PEN_INJECTOR | SUBCUTANEOUS | 2 refills | Status: AC

## 2021-11-08 NOTE — Patient Instructions (Signed)
start taking correction boluses for blood sugars going over 200 after dinner but need to do this within 1 to 2 hours after eating when the blood sugar is rising, need to put in extra 40-50 g for the extra bolus at that time, may need to do higher amounts if needed ?May need to do similar corrections after lunch if blood sugar is staying up over 200+ ?Try to avoid high-fat meals ?Consistently bolus before starting to eat every meal ? ?Please change correction factor I: 20 between 6 PM and 11 PM instead of 25 ?Correction factor of 1: 25 between 11 PM and 5 AM ?Stop using exercise mode overnight and only when he is significantly active or exercises ?Basal rate 1.10 at 12 noon and 1.20 at 1 PM ?Start OZEMPIC injections by dialing 0.25 mg on the pen once a week ? ?You will feel fullness of the stomach with starting the medication and try to keep the portions at meals small.  ?You may experience nausea in the first few days which usually gets better over time   ? ?After 4 weeks increase the dose to 0.5 mg weekly ? ?You may get a co-pay card online from [email or web address] ? ?  ?

## 2021-11-08 NOTE — Progress Notes (Signed)
? ? ?Patient ID: Alex Tucker, male   DOB: [birth year], 51 y.o.   MRN: [id number] ? ? ? ?Reason for Appointment: Follow-up of insulin-dependent diabetes ? ? ?History of Present Illness:  ?        ?Diagnosis: Type 1 diabetes mellitus, date of diagnosis:  2014     ? ?Past history:  ?He apparently had no symptoms at diagnosis and his blood sugar a year before at his physical was normal ?Details of his initial diagnosis are not available but he thinks his blood sugars were about 180 at that time with A1c around 9% ?He was initially tried on metformin but he could not tolerate this because of diarrhea ?Subsequently on his own he was trying to lose weight with diet and apparently lost 40 pounds around the end of 2014 ?His PCP started him on insulin in 12/14 approximately when his A1c was high and he was also losing weight. ?His initial consultation he was told to try Invokana and Victoza along with his basal insulin but he could not get Victoza because of non-coverage by his insurance.  Also he was having side effects of not feeling well and having some nausea with Invokana and stopped this in 8/15 after Also his Levemir was changed to Lantus insulin once a day; initially was started on 20 units  ? ?Recent history:  ? ?He had been on insulin pumps since 08/29/14 because of poor control with basal bolus insulin regimens; pre-pump pump A1c was >10% ? ?Current insulin pump: T-Slim control IQ since 11/20/2018 ? ?PUMP settings: ?Basal rates  midnight = 0.85, 1 AM = 0.8, 4 AM = 1.1, 7 AM = 1. 30.  10 AM = 1.25, 1 PM = 1.4,, 8 PM = 1.8, 10 PM = 1.7 and 11 PM = 1.2 ? ?Carbohydrate coverage 1:7 until 7 AM, 7 AM and 10 AM -  = 5.0, and 12 noon-11 PM = 1: 4 ?Sensitivity 1: 30 at midnight , 1: 25 from 7 AM, until 11 PM, with target 120 ?Active insulin 4 hours ? ?Total average insulin use per day is up to 90 units ? ?Current management, blood sugar patterns and problems identified are as follows: ? ?His A1c is 8.3 compared to 7.6   ? ?Although his A1c had previously been better overall recent level of control is not adequate even though in the last 2 weeks his time in range is similar to previous visits ?He has multiple basal rates throughout the day and night ? ?He was supposed to change the correction factor to 1: 20 between noon and 10 PM but he has not done so ?He has been living in a new community and likely eating differently although he does not think he is frequently having high fat meals ?His main difficulty is significant postprandial hyperglycemia in the evenings starting sometime after dinner and continuing into the early part of the night ? ?He occasionally will do correction doses of up to 15 units but mostly on the blood sugar is severely increased but not regularly ?He says that he cannot do correction boluses based on the current settings because the pump will have insulin on board from his bolus ?He is generally not eating much during the day and blood sugars are generally excellent ?Also having some tendency to slightly low readings before lunch around 1-2 PM ?Sleep mode has been turned off as before ?However he appears to have periodically turned on the exercise mode at all different times including  overnight anything this is by mistake ? ?Blood sugar patterns from the sensor download are interpreted as follows ? ?The highest blood sugars overall are in the late evening occasionally carrying into the night and are averaging over 200 in both the overnight and evening segments; however significant VARIABILITY is present  ? ?Overnight blood sugars are mostly increased in the early part of the night but has not as consistent as before  ? ?Postprandial readings are generally controlled after breakfast or lunch when he has a meal although occasionally will still go up excessively to as much as 250 after lunch  ?Postprandial readings after dinner are averaging about 250 between 8:30 PM and 11 PM with some variability ?He has only  2 nights where the blood sugar did not go up significantly after dinner in the last 2 weeks  ? ?Hypoglycemia has been present only between 12 PM-2 PM and occasional low normal readings early evening  ?Premeal readings are generally excellent at all meals and low normal at lunch at times ?  ?  ?CGM use % of time 95  ?2-week average/SD 172  ?Time in range        63%  ?% Time Above 180 36  ?% Time above 250   ?% Time Below 70 1  ? ?  ? ?PRE-MEAL  overnight  mornings  afternoon  evening Overall  ?Glucose range:       ?Averages: 202 132 129 225+/-90   ? ?Previously ? ?CGM use % of time   ?2-week average/SD 170/65  ?Time in range      62  %  ?% Time Above 180 22  ?% Time above 250 14  ?% Time Below 70 2  ? ?  ? ?PRE-MEAL  overnight  mornings  afternoon  evening Overall  ?Glucose range:       ?Averages: 158 146 197 192+/-57   ? ?Previously: ?  ?CGM use % of time   ?2-week average/SD 172  ?Time in range       63 %  ?% Time Above 180 22  ?% Time above 250 13  ?% Time Below 70 1  ? ?  ? ?PRE-MEAL  overnight  mornings  afternoon  evening Overall  ?Glucose range:       ?Averages: 204+/-92 150+/-57 150+/-38 180+/-79   ? ? ? ?Side effects from medications have been: Metformin causes diarrhea, Invokana causes nausea and malaise ? ? ?Glycemic control:  ? ?Lab Results  ?Component Value Date  ? HGBA1C 8.3 (H) 11/01/2021  ? HGBA1C 7.6 (A) 06/15/2021  ? HGBA1C 7.6 (A) 02/17/2021  ? ?Lab Results  ?Component Value Date  ? MICROALBUR <0.7 06/15/2021  ? [place] 84 06/15/2021  ? CREATININE 1.12 06/15/2021  ? ? ?Self-care: The diet that the patient has been following [id number] low fat ? Meals: 3 meals per day. breakfast: granola, lunch 9 am biscuit or sandwich; dinner 7-8 pm hs snack    ?       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      ?Dietician visit: Most recent:1/16             ?  ?Compliance with the medical regimen: good ? ?Weight history: Previous range 145-185 ? ?Wt  Readings from Last 3 Encounters:  ?06/15/21 167 lb (75.8 kg)  ?02/17/21 168 lb 12.8 oz (76.6 kg)  ?09/29/20 173 lb 3.2 oz (78.6 kg)  ? ? ?No visits  with results within 1 Week(s) from this visit.  ?Latest known visit with results is:  ?Orders Only on 10/05/2021  ?Component Date Value Ref Range Status  ? Hgb A1c MFr Bld 11/01/2021 8.3 (H)  4.8 - 5.6 % Final  ? Comment:          Prediabetes: 5.7 - 6.4 ?         Diabetes: >6.4 ?         Glycemic control for adults with diabetes: <7.0 ?  ? Est. average glucose Bld gHb Est-m* 11/01/2021 192  mg/dL Final  ? ? ?Allergies as of 11/08/2021   ? ?   Reactions  ? Codeine Itching  ? ?  ? ?  ?Medication List  ?  ? ?  ? Accurate as of November 08, 2021 12:59 PM. If you have any questions, ask your nurse or doctor.  ?  ?  ? ?  ? ?acetaminophen 325 MG tablet ?Commonly known as: TYLENOL ?Take 325-650 mg by mouth every 6 (six) hours as needed for moderate pain or headache. ?  ?aspirin-acetaminophen-caffeine 250-250-65 MG tablet ?Commonly known as: [place] ?Take 1 tablet by mouth every 8 (eight) hours as needed for headache. ?  ?atorvastatin 20 MG tablet ?Commonly known as: LIPITOR ?Take 1 tablet (20 mg total) by mouth at bedtime. ?  ?cephALEXin 500 MG capsule ?Commonly known as: KEFLEX ?Take 1 capsule (500 mg total) by mouth 4 (four) times daily. ?  ?citalopram 40 MG tablet ?Commonly known as: CELEXA ?Take 40 mg by mouth at bedtime. ?  ?Dexcom G6 Receiver Devi ?1 each by Does not apply route daily. Use receiver to monitor blood glucose continuously. ?  ?Dexcom G6 Sensor Misc ?APPLY 1 SENSOR TO THE BODY EVERY 10 DAYS FOR CONTINUOUS BLOOD SUGAR MONITORING ?  ?Dexcom G6 Transmitter Misc ?USE TRANSMITTER WITH G6 SENSOR DAILY ?  ?glucagon 1 MG Solr injection ?Commonly known as: GLUCAGEN ?Inject 1 mg into the vein once as needed for low blood sugar. Use as needed for hypoglycemia ?  ?ibuprofen 200 MG tablet ?Commonly known as: ADVIL ?Take 400 mg by mouth every 6 (six) hours as needed  for headache or moderate pain. ?  ?insulin aspart 100 UNIT/ML injection ?Commonly known as: novoLOG ?USE UP TO 100 UNITS DAILY WITH INSULIN PUMP ?  ?levETIRAcetam 1000 MG tablet ?Commonly known as: KEPPRA ?Take

## 2022-01-04 ENCOUNTER — Other Ambulatory Visit (HOSPITAL_COMMUNITY): Payer: Self-pay

## 2022-01-04 ENCOUNTER — Telehealth: Payer: Self-pay

## 2022-01-04 NOTE — Telephone Encounter (Signed)
Patient Advocate Encounter ?  ?Received notification from [employer] that prior authorization for Dexcom G6 Sensor is required. I tried to send prior authorization to plan, but per plan it is stating it is too soon to initiate.  ? ?
